# Patient Record
Sex: Female | Born: 1960 | Race: White | Hispanic: No | Marital: Married | Smoking: Never smoker
Health system: Southern US, Community
[De-identification: ages and names within clinical notes are randomized; demographics above are authoritative.]

## PROBLEM LIST (undated history)

## (undated) DIAGNOSIS — K900 Celiac disease: Secondary | ICD-10-CM

## (undated) DIAGNOSIS — I341 Nonrheumatic mitral (valve) prolapse: Secondary | ICD-10-CM

## (undated) DIAGNOSIS — F419 Anxiety disorder, unspecified: Secondary | ICD-10-CM

## (undated) DIAGNOSIS — K449 Diaphragmatic hernia without obstruction or gangrene: Secondary | ICD-10-CM

## (undated) DIAGNOSIS — C801 Malignant (primary) neoplasm, unspecified: Secondary | ICD-10-CM

## (undated) DIAGNOSIS — T7840XA Allergy, unspecified, initial encounter: Secondary | ICD-10-CM

## (undated) DIAGNOSIS — I10 Essential (primary) hypertension: Secondary | ICD-10-CM

## (undated) HISTORY — DX: Malignant (primary) neoplasm, unspecified: C80.1

## (undated) HISTORY — DX: Celiac disease: K90.0

## (undated) HISTORY — PX: MENISCUS REPAIR: SHX5179

## (undated) HISTORY — DX: Diaphragmatic hernia without obstruction or gangrene: K44.9

## (undated) HISTORY — DX: Allergy, unspecified, initial encounter: T78.40XA

## (undated) HISTORY — PX: MELANOMA EXCISION: SHX5266

## (undated) HISTORY — DX: Nonrheumatic mitral (valve) prolapse: I34.1

## (undated) HISTORY — DX: Essential (primary) hypertension: I10

## (undated) HISTORY — DX: Anxiety disorder, unspecified: F41.9

## (undated) HISTORY — PX: TRIGGER FINGER RELEASE: SHX641

---

## 2016-11-20 LAB — HM MAMMOGRAPHY

## 2017-01-14 LAB — HM COLONOSCOPY

## 2017-09-23 LAB — BASIC METABOLIC PANEL
BUN: 13 (ref 4–21)
Creatinine: 0.8 (ref <=1.1)
Glucose: 104
Potassium: 4.6 (ref 3.4–5.3)
Sodium: 141 (ref 137–147)

## 2017-09-23 LAB — CBC AND DIFFERENTIAL
HCT: 40 (ref 36–46)
Hemoglobin: 13.6 (ref 12.0–16.0)
Neutrophils Absolute: 4448
Platelets: 148 — AB (ref 150–399)
WBC: 6.6

## 2017-09-23 LAB — HEPATIC FUNCTION PANEL
ALT: 35 (ref 7–35)
AST: 17 (ref 13–35)
Alkaline Phosphatase: 64 (ref 25–125)
Bilirubin, Total: 0.9

## 2017-09-23 LAB — LIPID PANEL
Cholesterol: 184 (ref 0–200)
HDL: 42 (ref 35–70)
LDL Cholesterol: 120
Triglycerides: 111 (ref 40–160)

## 2017-09-23 LAB — TSH: TSH: 1.37 (ref <=5.90)

## 2018-05-02 DIAGNOSIS — M65312 Trigger thumb, left thumb: Secondary | ICD-10-CM | POA: Diagnosis not present

## 2018-05-12 DIAGNOSIS — M65312 Trigger thumb, left thumb: Secondary | ICD-10-CM | POA: Diagnosis not present

## 2018-05-24 DIAGNOSIS — M65312 Trigger thumb, left thumb: Secondary | ICD-10-CM | POA: Diagnosis not present

## 2018-06-23 DIAGNOSIS — R030 Elevated blood-pressure reading, without diagnosis of hypertension: Secondary | ICD-10-CM | POA: Diagnosis not present

## 2018-06-23 DIAGNOSIS — Z6831 Body mass index (BMI) 31.0-31.9, adult: Secondary | ICD-10-CM | POA: Diagnosis not present

## 2018-06-23 DIAGNOSIS — I10 Essential (primary) hypertension: Secondary | ICD-10-CM | POA: Diagnosis not present

## 2018-07-04 DIAGNOSIS — L219 Seborrheic dermatitis, unspecified: Secondary | ICD-10-CM | POA: Diagnosis not present

## 2018-07-04 DIAGNOSIS — D225 Melanocytic nevi of trunk: Secondary | ICD-10-CM | POA: Diagnosis not present

## 2018-07-04 DIAGNOSIS — D229 Melanocytic nevi, unspecified: Secondary | ICD-10-CM | POA: Diagnosis not present

## 2018-07-04 DIAGNOSIS — Z86006 Personal history of melanoma in-situ: Secondary | ICD-10-CM | POA: Diagnosis not present

## 2018-07-04 DIAGNOSIS — D485 Neoplasm of uncertain behavior of skin: Secondary | ICD-10-CM | POA: Diagnosis not present

## 2018-07-04 DIAGNOSIS — L821 Other seborrheic keratosis: Secondary | ICD-10-CM | POA: Diagnosis not present

## 2018-07-04 DIAGNOSIS — Z9889 Other specified postprocedural states: Secondary | ICD-10-CM | POA: Diagnosis not present

## 2018-08-16 ENCOUNTER — Encounter: Payer: Self-pay | Admitting: Adult Health

## 2018-08-16 ENCOUNTER — Other Ambulatory Visit: Payer: Self-pay

## 2018-08-16 ENCOUNTER — Ambulatory Visit (INDEPENDENT_AMBULATORY_CARE_PROVIDER_SITE_OTHER): Payer: BLUE CROSS/BLUE SHIELD | Admitting: Adult Health

## 2018-08-16 VITALS — Ht 66.0 in | Wt 190.0 lb

## 2018-08-16 DIAGNOSIS — K219 Gastro-esophageal reflux disease without esophagitis: Secondary | ICD-10-CM | POA: Insufficient documentation

## 2018-08-16 DIAGNOSIS — F418 Other specified anxiety disorders: Secondary | ICD-10-CM | POA: Insufficient documentation

## 2018-08-16 DIAGNOSIS — Z0000 Encounter for general adult medical examination without abnormal findings: Secondary | ICD-10-CM | POA: Diagnosis not present

## 2018-08-16 DIAGNOSIS — I10 Essential (primary) hypertension: Secondary | ICD-10-CM | POA: Diagnosis not present

## 2018-08-16 MED ORDER — ALBUTEROL SULFATE HFA 108 (90 BASE) MCG/ACT IN AERS: 2.0000 | INHALATION_SPRAY | Freq: Four times a day (QID) | RESPIRATORY_TRACT | 1 refills | Status: AC | PRN

## 2018-08-16 MED ORDER — LOSARTAN POTASSIUM 100 MG PO TABS: 100.0000 mg | ORAL_TABLET | Freq: Every day | ORAL | 2 refills | Status: DC

## 2018-08-16 NOTE — Assessment & Plan Note (Signed)
  Assessment and Plan: Continue all medications as directed Remain well hydrated, follow Mediterranean Diet Reduce CHO snacking Continue regular exercise Try Esomeprazole very other day for GERD control- in attempts to wean off COVID-19 Education: Signs and symptoms of COVID-19 infection were discussed with pt and how to seek care for testing.  The importance of following the Stay at Home order, and when out- Social Distancing and wearing a facial mask were discussed today.  Follow Up Instructions: 3 months CPE- week prior with fasting lab appt    I discussed the assessment and treatment plan with the patient. The patient was provided an opportunity to ask questions and all were answered. The patient agreed with the plan and demonstrated an understanding of the instructions.   The patient was advised to call back or seek an in-person evaluation if the symptoms worsen or if the condition fails to improve as anticipated.

## 2018-08-16 NOTE — Assessment & Plan Note (Signed)
When flying- Alprazolam 0.25mg  PRN Does not need refill

## 2018-08-16 NOTE — Assessment & Plan Note (Signed)
Has been on esomprazole for years and concerned about SE due to long term use Recommend using over other day for sx control and to see if she can wean off Avoid spicy/acidic foods Do not over-eat

## 2018-08-16 NOTE — Progress Notes (Signed)
Virtual Visit via Telephone Note  I connected with Diamond Trujillo on 08/16/18 at 11:15 AM EDT by telephone and verified that I am speaking with the correct person using two identifiers.  Location: Patient: Home Provider: In Clinic   I discussed the limitations, risks, security and privacy concerns of performing an evaluation and management service by telephone and the availability of in person appointments. I also discussed with the patient that there may be a patient responsible charge related to this service. The patient expressed understanding and agreed to proceed.   History of Present Illness: Diamond Trujillo calls in to establish as a new pt. She is a pleasant 58 year old female. PMH: Obesity, Celiac Disease, GERD, Hiatal Hernia,  HTN- losartan 100mg  QD  She reports >25 lbs weight gain since moving to [place]- current wt 190 She reports enjoying too many CHO/glutten free desserts She and her husband started regular exercise program- Walking daily 5-6: 1.5 mile  [employer] 5-6  She denies tobacco/vape use She enjoys 5 cocktails/week She reports that her mother passed away at age 65 from lung ca with mets to brain- she was extremely heavy smoker and excessive ETOH use She has no knowledge of her biological father Her half-siblings are all in good health Review of Systems: General:   Denies fever, chills, unexplained weight loss.  Optho/Auditory:   Denies visual changes, blurred vision/LOV Respiratory:   Denies SOB, DOE more than baseline levels.  Cardiovascular:   Denies chest pain, palpitations, new onset peripheral edema  Gastrointestinal:   Denies nausea, vomiting, diarrhea.  Genitourinary: Denies dysuria, freq/ urgency, flank pain or discharge from genitals.  Endocrine:     Denies hot or cold intolerance, polyuria, polydipsia. Musculoskeletal:   Denies unexplained myalgias, joint swelling, unexplained arthralgias, gait problems.  Skin:  Denies rash, suspicious  lesions Neurological:     Denies dizziness, unexplained weakness, numbness  Psychiatric/Behavioral:   Denies mood changes, suicidal or homicidal ideations, hallucinations COVID-19 Education: Signs and symptoms of COVID-19 infection were discussed with pt and how to seek care for testing.  The importance of following the Stay at Home order, and when out- Social Distancing and wearing a facial mask were discussed today.   Patient Care Team    Relationship Specialty Notifications Start End  Esaw Grandchild, NP PCP - General Family Medicine  08/14/18     Patient Active Problem List   Diagnosis Date Noted  . Healthcare maintenance 08/16/2018  . Situational anxiety 08/16/2018  . HTN, goal below 130/80 08/16/2018     Past Medical History:  Diagnosis Date  . Cancer ([place])    melanoma  . Celiac disease   . Hypertension   . MVP (mitral valve prolapse)      Past Surgical History:  Procedure Laterality Date  . CESAREAN SECTION    . MELANOMA EXCISION    . MENISCUS REPAIR Right      Family History  Problem Relation Age of Onset  . Cancer Mother        liver, lung, brain  . Hypertension Mother   . Alcohol abuse Mother      Social History   Substance and Sexual Activity  Drug Use Never     Social History   Substance and Sexual Activity  Alcohol Use Yes  . Alcohol/week: 5.0 standard drinks  . Types: 5 Cans of beer per week     Social History   Tobacco Use  Smoking Status Never Smoker  Smokeless Tobacco Never  Used     Outpatient Encounter Medications as of 08/16/2018  Medication Sig  . albuterol (VENTOLIN HFA) 108 (90 Base) MCG/ACT inhaler Inhale 2 puffs into the lungs every 6 (six) hours as needed for wheezing or shortness of breath.  . ALPRAZolam (XANAX) 0.25 MG tablet Take 0.25 mg by mouth. Take 1 tablet prior to flying  . esomeprazole (NEXIUM) 20 MG packet Take 20 mg by mouth daily before breakfast.  . losartan (COZAAR) 100 MG tablet Take 1 tablet (100 mg  total) by mouth daily.  . [DISCONTINUED] albuterol (VENTOLIN HFA) 108 (90 Base) MCG/ACT inhaler Inhale 2 puffs into the lungs every 6 (six) hours as needed for wheezing or shortness of breath.  . [DISCONTINUED] losartan (COZAAR) 100 MG tablet Take 1 tablet by mouth daily.   No facility-administered encounter medications on file as of 08/16/2018.     Allergies: Gluten meal  Body mass index is 30.67 kg/m.  Height 5\' 6"  (1.676 m), weight 190 lb (86.2 kg).    Observations/Objective: No acute distress noted during the telephone   Assessment and Plan: Continue all medications as directed Remain well hydrated, follow Mediterranean Diet Reduce CHO snacking Continue regular exercise Try Esomeprazole very other day for GERD control- in attempts to wean off COVID-19 Education: Signs and symptoms of COVID-19 infection were discussed with pt and how to seek care for testing.  The importance of following the Stay at Home order, and when out- Social Distancing and wearing a facial mask were discussed today.  Follow Up Instructions: 3 months CPE- week prior with fasting lab appt    I discussed the assessment and treatment plan with the patient. The patient was provided an opportunity to ask questions and all were answered. The patient agreed with the plan and demonstrated an understanding of the instructions.   The patient was advised to call back or seek an in-person evaluation if the symptoms worsen or if the condition fails to improve as anticipated.  I provided 35 minutes of non-face-to-face time during this encounter.   Esaw Grandchild, NP

## 2018-08-16 NOTE — Assessment & Plan Note (Signed)
Losartan 100mg  QD, increased from 50mg  to 100mg  in 2018

## 2018-08-17 LAB — EKG 12-LEAD

## 2018-08-17 NOTE — Addendum Note (Signed)
Addended by: Mina Marble D on: 08/17/2018 03:23 PM   Modules accepted: Orders

## 2018-08-22 DIAGNOSIS — D239 Other benign neoplasm of skin, unspecified: Secondary | ICD-10-CM | POA: Diagnosis not present

## 2018-09-05 ENCOUNTER — Encounter: Payer: Self-pay | Admitting: Adult Health

## 2018-09-05 ENCOUNTER — Other Ambulatory Visit: Payer: Self-pay

## 2018-09-05 ENCOUNTER — Ambulatory Visit: Payer: BLUE CROSS/BLUE SHIELD | Admitting: Adult Health

## 2018-09-05 VITALS — BP 133/76 | HR 68 | Temp 98.1°F | Ht 66.0 in | Wt 193.5 lb

## 2018-09-05 DIAGNOSIS — Z4802 Encounter for removal of sutures: Secondary | ICD-10-CM | POA: Diagnosis not present

## 2018-09-05 DIAGNOSIS — Z0000 Encounter for general adult medical examination without abnormal findings: Secondary | ICD-10-CM

## 2018-09-05 NOTE — Assessment & Plan Note (Addendum)
One running suture removed from L posterior back by Jennet Maduro CMA Pt tolerated well. 0.5cm portion of L side suture line slightly opened- closed with dermabond- dressed with tefla and tex[place] band-aid. Site cleaned prior to and after procedure, dressed as described above. Please call your dermatologist with any concerns about healing.

## 2018-09-05 NOTE — Assessment & Plan Note (Signed)
Continue to social distance and wear a mask when in public 

## 2018-09-05 NOTE — Progress Notes (Signed)
Subjective:    Patient ID: Diamond Trujillo, female    DOB: 12/01/1960, 58 y.o.   MRN: [id number]  HPI:  Diamond Trujillo presents today for suture removal r/t suspicious mole removal completed 14 days ago at [facility]/Dr. Wrestler. Per pt, pathology was benign. She denies pain or drainage at site. She reports intermittent itching. She denies fever/night sweats/N/V/D. She denies tobacco use, reports diet rich in protein.  Patient Care Team    Relationship Specialty Notifications Start End  Esaw Grandchild, NP PCP - General Family Medicine  08/14/18     Patient Active Problem List   Diagnosis Date Noted  . Visit for suture removal 09/05/2018  . Healthcare maintenance 08/16/2018  . Situational anxiety 08/16/2018  . HTN, goal below 130/80 08/16/2018  . Gastroesophageal reflux disease 08/16/2018     Past Medical History:  Diagnosis Date  . Cancer ([place])    melanoma  . Celiac disease   . Hypertension   . MVP (mitral valve prolapse)      Past Surgical History:  Procedure Laterality Date  . CESAREAN SECTION    . MELANOMA EXCISION    . MENISCUS REPAIR Right      Family History  Problem Relation Age of Onset  . Cancer Mother        liver, lung, brain  . Hypertension Mother   . Alcohol abuse Mother      Social History   Substance and Sexual Activity  Drug Use Never     Social History   Substance and Sexual Activity  Alcohol Use Yes  . Alcohol/week: 5.0 standard drinks  . Types: 5 Cans of beer per week     Social History   Tobacco Use  Smoking Status Never Smoker  Smokeless Tobacco Never Used     Outpatient Encounter Medications as of 09/05/2018  Medication Sig  . albuterol (VENTOLIN HFA) 108 (90 Base) MCG/ACT inhaler Inhale 2 puffs into the lungs every 6 (six) hours as needed for wheezing or shortness of breath.  . ALPRAZolam (XANAX) 0.25 MG tablet Take 0.25 mg by mouth. Take 1 tablet prior to flying  . esomeprazole (NEXIUM) 20 MG packet Take 20 mg  by mouth daily before breakfast.  . losartan (COZAAR) 100 MG tablet Take 1 tablet (100 mg total) by mouth daily.   No facility-administered encounter medications on file as of 09/05/2018.     Allergies: Gluten meal  Body mass index is 31.23 kg/m.  Blood pressure 133/76, pulse 68, temperature 98.1 F (36.7 C), temperature source Oral, height 5\' 6"  (1.676 m), weight 193 lb 8 oz (87.8 kg).  Review of Systems  Constitutional: Positive for fatigue. Negative for activity change, appetite change, chills, diaphoresis, fever and unexpected weight change.  Respiratory: Negative for cough, chest tightness, shortness of breath, wheezing and stridor.   Cardiovascular: Negative for chest pain, palpitations and leg swelling.  Gastrointestinal: Negative for abdominal distention, abdominal pain, blood in stool, constipation, diarrhea, nausea, rectal pain and vomiting.  Genitourinary: Negative for difficulty urinating and flank pain.  Skin: Positive for color change and wound. Negative for pallor and rash.  Neurological: Negative for dizziness and headaches.  Hematological: Does not bruise/bleed easily.  Psychiatric/Behavioral: Negative for agitation, behavioral problems, confusion, decreased concentration, dysphoric mood, hallucinations, self-injury, sleep disturbance and suicidal ideas. The patient is not nervous/anxious and is not hyperactive.        Objective:   Physical Exam Vitals signs and nursing note reviewed.  Constitutional:  General: She is not in acute distress.    Appearance: Normal appearance. She is normal weight. She is not ill-appearing, toxic-appearing or diaphoretic.  Skin:    General: Skin is warm.     Capillary Refill: Capillary refill takes less than 2 seconds.     Findings: Erythema and laceration present.          Comments: Approx 4cm linear laceration on L posterior back One running suture removed from L posterior back by Jennet Maduro CMA Pt tolerated well.  0.5cm portion of L side suture line slightly opened- closed with dermabond- dressed with tefla and texas band-aid. Site cleaned prior to and after procedure, dressed as described above.  Neurological:     Mental Status: She is alert and oriented to person, place, and time.  Psychiatric:        Mood and Affect: Mood normal.        Behavior: Behavior normal.        Thought Content: Thought content normal.        Judgment: Judgment normal.           Assessment & Plan:   1. Visit for suture removal   2. Healthcare maintenance     Visit for suture removal One running suture removed from L posterior back by Jennet Maduro CMA Pt tolerated well. 0.5cm portion of L side suture line slightly opened- closed with dermabond- dressed with tefla and texas band-aid. Site cleaned prior to and after procedure, dressed as described above. Please call your dermatologist with any concerns about healing.   Healthcare maintenance Continue to social distance and wear a mask when in public.    FOLLOW-UP:  Return if symptoms worsen or fail to improve.

## 2018-09-05 NOTE — Patient Instructions (Signed)
Wound Care, Adult Taking care of your wound properly can help to prevent pain, infection, and scarring. It can also help your wound to heal more quickly. How to care for your wound Wound care      Follow instructions from your health care provider about how to take care of your wound. Make sure you: ? Wash your hands with soap and water before you change the bandage (dressing). If soap and water are not available, use hand sanitizer. ? Change your dressing as told by your health care provider. ? Leave stitches (sutures), skin glue, or adhesive strips in place. These skin closures may need to stay in place for 2 weeks or longer. If adhesive strip edges start to loosen and curl up, you may trim the loose edges. Do not remove adhesive strips completely unless your health care provider tells you to do that.  Check your wound area every day for signs of infection. Check for: ? Redness, swelling, or pain. ? Fluid or blood. ? Warmth. ? Pus or a bad smell.  Ask your health care provider if you should clean the wound with mild soap and water. Doing this may include: ? Using a clean towel to pat the wound dry after cleaning it. Do not rub or scrub the wound. ? Applying a cream or ointment. Do this only as told by your health care provider. ? Covering the incision with a clean dressing.  Ask your health care provider when you can leave the wound uncovered.  Keep the dressing dry until your health care provider says it can be removed. Do not take baths, swim, use a hot tub, or do anything that would put the wound underwater until your health care provider approves. Ask your health care provider if you can take showers. You may only be allowed to take sponge baths. Medicines   If you were prescribed an antibiotic medicine, cream, or ointment, take or use the antibiotic as told by your health care provider. Do not stop taking or using the antibiotic even if your condition improves.  Take  over-the-counter and prescription medicines only as told by your health care provider. If you were prescribed pain medicine, take it 30 or more minutes before you do any wound care or as told by your health care provider. General instructions  Return to your normal activities as told by your health care provider. Ask your health care provider what activities are safe.  Do not scratch or pick at the wound.  Do not use any products that contain nicotine or tobacco, such as cigarettes and e-cigarettes. These may delay wound healing. If you need help quitting, ask your health care provider.  Keep all follow-up visits as told by your health care provider. This is important.  Eat a diet that includes protein, vitamin A, vitamin C, and other nutrient-rich foods to help the wound heal. ? Foods rich in protein include meat, dairy, beans, nuts, and other sources. ? Foods rich in vitamin A include carrots and dark green, leafy vegetables. ? Foods rich in vitamin C include citrus, tomatoes, and other fruits and vegetables. ? Nutrient-rich foods have protein, carbohydrates, fat, vitamins, or minerals. Eat a variety of healthy foods including vegetables, fruits, and whole grains. Contact a health care provider if:  You received a tetanus shot and you have swelling, severe pain, redness, or bleeding at the injection site.  Your pain is not controlled with medicine.  You have redness, swelling, or pain around the wound.    You have fluid or blood coming from the wound.  Your wound feels warm to the touch.  You have pus or a bad smell coming from the wound.  You have a fever or chills.  You are nauseous or you vomit.  You are dizzy. Get help right away if:  You have a red streak going away from your wound.  The edges of the wound open up and separate.  Your wound is bleeding, and the bleeding does not stop with gentle pressure.  You have a rash.  You faint.  You have trouble breathing.  Summary  Always wash your hands with soap and water before changing your bandage (dressing).  To help with healing, eat foods that are rich in protein, vitamin A, vitamin C, and other nutrients.  Check your wound every day for signs of infection. Contact your health care provider if you suspect that your wound is infected. This information is not intended to replace advice given to you by your health care provider. Make sure you discuss any questions you have with your health care provider. Document Released: 01/06/2008 Document Revised: 05/10/2017 Document Reviewed: 10/14/2015 Elsevier Interactive Patient Education  2019 [place].  Please call your dermatologist with any concerns about healing. Continue to social distance and wear a mask when in public. NICE TO SEE YOU!

## 2018-09-11 ENCOUNTER — Encounter: Payer: Self-pay | Admitting: Obstetrics & Gynecology

## 2018-09-20 DIAGNOSIS — M19071 Primary osteoarthritis, right ankle and foot: Secondary | ICD-10-CM | POA: Diagnosis not present

## 2018-10-17 DIAGNOSIS — D2270 Melanocytic nevi of unspecified lower limb, including hip: Secondary | ICD-10-CM | POA: Diagnosis not present

## 2018-10-17 DIAGNOSIS — D489 Neoplasm of uncertain behavior, unspecified: Secondary | ICD-10-CM | POA: Diagnosis not present

## 2018-10-17 DIAGNOSIS — Z86006 Personal history of melanoma in-situ: Secondary | ICD-10-CM | POA: Diagnosis not present

## 2018-10-17 DIAGNOSIS — Z86018 Personal history of other benign neoplasm: Secondary | ICD-10-CM | POA: Diagnosis not present

## 2018-11-08 ENCOUNTER — Other Ambulatory Visit: Payer: Self-pay

## 2018-11-10 ENCOUNTER — Other Ambulatory Visit: Payer: Self-pay

## 2018-11-10 ENCOUNTER — Ambulatory Visit: Payer: BC Managed Care – PPO | Admitting: Obstetrics & Gynecology

## 2018-11-10 ENCOUNTER — Other Ambulatory Visit (HOSPITAL_COMMUNITY)
Admission: RE | Admit: 2018-11-10 | Discharge: 2018-11-10 | Disposition: A | Discharge status: Home / Self Care | Payer: BC Managed Care – PPO | Source: Ambulatory Visit | Attending: Obstetrics & Gynecology | Admitting: Obstetrics & Gynecology

## 2018-11-10 ENCOUNTER — Encounter: Payer: Self-pay | Admitting: Obstetrics & Gynecology

## 2018-11-10 VITALS — BP 128/76 | HR 72 | Temp 97.2°F | Ht 65.0 in | Wt 193.0 lb

## 2018-11-10 DIAGNOSIS — Z124 Encounter for screening for malignant neoplasm of cervix: Secondary | ICD-10-CM | POA: Diagnosis not present

## 2018-11-10 DIAGNOSIS — Z01419 Encounter for gynecological examination (general) (routine) without abnormal findings: Secondary | ICD-10-CM

## 2018-11-10 NOTE — Progress Notes (Signed)
58 y.o. G3P3 Married White or Caucasian female here as new patient for annual exam.  [place].  She is establishing care.  She works from home and her job is based in [place].  She has been working from home for over a year.  Denies vaginal bleeding.  Not on HRT.      Frustrated with weight.    Patient's last menstrual period was 04/12/2016 (approximate).          Sexually active: Yes.    The current method of family planning is post menopausal status.    Exercising: Yes.    HIT Smoker:  no  Health Maintenance: Pap:  10/12/17 neg History of abnormal Pap:  no MMG:  11/20/16 Normal Colonoscopy:  01/14/17 f/u 10 years BMD:   Never TDaP:  Unsure  Pneumonia vaccine(s):  n/a Shingrix:   No Hep C testing: No Screening Labs: PCP   reports that she has never smoked. She has never used smokeless tobacco. She reports current alcohol use of about 5.0 standard drinks of alcohol per week. She reports that she does not use drugs.  Past Medical History:  Diagnosis Date  . Anxiety   . Cancer ([place])    melanoma  . Celiac disease   . Hiatal hernia   . High blood pressure   . Hypertension   . Melanoma ([place])   . MVP (mitral valve prolapse)     Past Surgical History:  Procedure Laterality Date  . CESAREAN SECTION     x 3  . MELANOMA EXCISION    . MENISCUS REPAIR Right   . TRIGGER FINGER RELEASE     Left Thumb     Current Outpatient Medications  Medication Sig Dispense Refill  . albuterol (VENTOLIN HFA) 108 (90 Base) MCG/ACT inhaler Inhale 2 puffs into the lungs every 6 (six) hours as needed for wheezing or shortness of breath. 1 Inhaler 1  . esomeprazole (NEXIUM) 20 MG packet Take 20 mg by mouth daily before breakfast.    . losartan (COZAAR) 100 MG tablet Take 1 tablet (100 mg total) by mouth daily. 90 tablet 2  . meloxicam (MOBIC) 15 MG tablet Take 15 mg by mouth daily as needed for pain.    Marland Kitchen ALPRAZolam (XANAX) 0.25 MG tablet Take 0.25 mg by  mouth. Take 1 tablet prior to flying     No current facility-administered medications for this visit.     Family History  Problem Relation Age of Onset  . Cancer Mother        liver, lung, brain  . Hypertension Mother   . Alcohol abuse Mother     Review of Systems  All other systems reviewed and are negative.   Exam:   BP 128/76   Pulse 72   Temp (!) 97.2 F (36.2 C) (Temporal)   Ht 5\' 5"  (1.651 m)   Wt 193 lb (87.5 kg)   LMP 04/12/2016 (Approximate)   BMI 32.12 kg/m    Height: 5\' 5"  (165.1 cm)  Ht Readings from Last 3 Encounters:  11/10/18 5\' 5"  (1.651 m)  09/05/18 5\' 6"  (1.676 m)  08/16/18 5\' 6"  (1.676 m)    General appearance: alert, cooperative and appears stated age Head: Normocephalic, without obvious abnormality, atraumatic Neck: no adenopathy, supple, symmetrical, trachea midline and thyroid normal to inspection and palpation Lungs: clear to auscultation bilaterally Breasts: normal appearance, no masses or tenderness Heart: regular rate and rhythm Abdomen: soft, non-tender; bowel  sounds normal; no masses,  no organomegaly Extremities: extremities normal, atraumatic, no cyanosis or edema Skin: Skin color, texture, turgor normal. No rashes or lesions Lymph nodes: Cervical, supraclavicular, and axillary nodes normal. No abnormal inguinal nodes palpated Neurologic: Grossly normal   Pelvic: External genitalia:  no lesions              Urethra:  normal appearing urethra with no masses, tenderness or lesions              Bartholins and Skenes: normal                 Vagina: normal appearing vagina with normal color and discharge, no lesions              Cervix: no lesions              Pap taken: Yes.   Bimanual Exam:  Uterus:  normal size, contour, position, consistency, mobility, non-tender              Adnexa: normal adnexa and no mass, fullness, tenderness               Rectovaginal: Confirms               Anus:  normal sphincter tone, no  lesions  Chaperone was present for exam.  A:  Well Woman with normal exam PMP, no HRT Hypertension Celiac disease  P:   Mammogram guidelines reviewed.  Information given.  3D MMG.  Information about locations given to pt today. pap smear with HR HPV obtained Lab work  Reviewed Tdap.  She will need this at some point as she does not know when her last one was.  Declines today. Colonoscopy UTD Plan BMD around age 60 Return annually or prn

## 2018-11-10 NOTE — Patient Instructions (Addendum)
[facility]   [address] [place], [place] [address]  [place], MD  [facility] [address], [place], [place] [address]  Phone:  [phone]

## 2018-11-14 LAB — CYTOLOGY - PAP
Diagnosis: NEGATIVE
HPV: NOT DETECTED

## 2018-12-04 ENCOUNTER — Other Ambulatory Visit: Payer: BC Managed Care – PPO

## 2018-12-04 ENCOUNTER — Other Ambulatory Visit: Payer: Self-pay

## 2018-12-04 DIAGNOSIS — Z0000 Encounter for general adult medical examination without abnormal findings: Secondary | ICD-10-CM

## 2018-12-04 DIAGNOSIS — I10 Essential (primary) hypertension: Secondary | ICD-10-CM

## 2018-12-05 LAB — CBC WITH DIFFERENTIAL/PLATELET
Basophils Absolute: 0.1 10*3/uL (ref 0.0–0.2)
Basos: 1 %
EOS (ABSOLUTE): 0.1 10*3/uL (ref 0.0–0.4)
Eos: 1 %
Hematocrit: 40.7 % (ref 34.0–46.6)
Hemoglobin: 14 g/dL (ref 11.1–15.9)
Immature Grans (Abs): 0 10*3/uL (ref 0.0–0.1)
Immature Granulocytes: 0 %
Lymphocytes Absolute: 1.6 10*3/uL (ref 0.7–3.1)
Lymphs: 25 %
MCH: 30.6 pg (ref 26.6–33.0)
MCHC: 34.4 g/dL (ref 31.5–35.7)
MCV: 89 fL (ref 79–97)
Monocytes Absolute: 0.5 10*3/uL (ref 0.1–0.9)
Monocytes: 8 %
Neutrophils Absolute: 4 10*3/uL (ref 1.4–7.0)
Neutrophils: 65 %
Platelets: 165 10*3/uL (ref 150–450)
RBC: 4.58 x10E6/uL (ref 3.77–5.28)
RDW: 12.2 % (ref 11.7–15.4)
WBC: 6.3 10*3/uL (ref 3.4–10.8)

## 2018-12-05 LAB — LIPID PANEL
Chol/HDL Ratio: 3.9 ratio (ref 0.0–4.4)
Cholesterol, Total: 173 mg/dL (ref 100–199)
HDL: 44 mg/dL (ref >39)
LDL Calculated: 104 mg/dL — ABNORMAL HIGH (ref 0–99)
Triglycerides: 124 mg/dL (ref 0–149)
VLDL Cholesterol Cal: 25 mg/dL (ref 5–40)

## 2018-12-05 LAB — HEMOGLOBIN A1C
Est. average glucose Bld gHb Est-mCnc: 114 mg/dL
Hgb A1c MFr Bld: 5.6 % (ref 4.8–5.6)

## 2018-12-05 LAB — COMPREHENSIVE METABOLIC PANEL
ALT: 32 IU/L (ref 0–32)
AST: 19 IU/L (ref 0–40)
Albumin/Globulin Ratio: 2.7 — ABNORMAL HIGH (ref 1.2–2.2)
Albumin: 4.8 g/dL (ref 3.8–4.9)
Alkaline Phosphatase: 69 IU/L (ref 39–117)
BUN/Creatinine Ratio: 17 (ref 9–23)
BUN: 14 mg/dL (ref 6–24)
Bilirubin Total: 0.8 mg/dL (ref 0.0–1.2)
CO2: 25 mmol/L (ref 20–29)
Calcium: 9.9 mg/dL (ref 8.7–10.2)
Chloride: 100 mmol/L (ref 96–106)
Creatinine, Ser: 0.81 mg/dL (ref 0.57–1.00)
GFR calc Af Amer: 93 mL/min/{1.73_m2} (ref >59)
GFR calc non Af Amer: 81 mL/min/{1.73_m2} (ref >59)
Globulin, Total: 1.8 g/dL (ref 1.5–4.5)
Glucose: 112 mg/dL — ABNORMAL HIGH (ref 65–99)
Potassium: 5 mmol/L (ref 3.5–5.2)
Sodium: 139 mmol/L (ref 134–144)
Total Protein: 6.6 g/dL (ref 6.0–8.5)

## 2018-12-05 LAB — TSH: TSH: 1.43 u[IU]/mL (ref 0.450–4.500)

## 2018-12-11 NOTE — Progress Notes (Signed)
Subjective:    Patient ID: Diamond Trujillo, female    DOB: Oct 24, 1960, 58 y.o.   MRN: [id number]  HPI:  08/16/2018 OV: Diamond Trujillo calls in to establish as a new pt. She is a pleasant 58 year old female. PMH: Obesity, Celiac Disease, GERD, Hiatal Hernia,  HTN- losartan 100mg  QD  She reports >25 lbs weight gain since moving to [place]- current wt 190 She reports enjoying too many CHO/glutten free desserts She and her husband started regular exercise program- Walking daily 5-6: 1.5 mile  Team Body Product 5-6  She denies tobacco/vape use She enjoys 5 cocktails/week She reports that her mother passed away at age 7 from lung ca with mets to brain- she was extremely heavy smoker and excessive ETOH use She has no knowledge of her biological father Her half-siblings are all in good health 12/12/2018 OV: Diamond Trujillo is here for CPE She has lost >20 lbs in last 2 months due to intermittent fasting Current wt 190 Body mass index is 31.7 kg/m.  She estimates to drink >75 oz water/day She remains active with walking and house/yard work She continues to abstain from tobacco/vape/ETOH use  Reviewed Labs 12/04/2018: TSH-WNL, 1.430  A1c-WNL, 5.6  CMP-stable  CBC-stable  The 10-year ASCVD risk score Diamond Bussing DC Jr., et al., 2013) is: 3.4%  Values used to calculate the score:   Age: 61 years   Sex: Female   Is Non-Hispanic African American: No   Diabetic: No   Tobacco smoker: No   Systolic Blood Pressure: 0000000 mmHg   Is BP treated: Yes   HDL Cholesterol: 44 mg/dL   Total Cholesterol: 173 mg/dL  LDL-104   Healthcare Maintenance: PAP-11/10/2018, completed with OB/GYN Mammogram-she will schedule Colonoscopy-UTD Immunizations-UTD  Patient Care Team    Relationship Specialty Notifications Start End  Esaw Grandchild, NP PCP - General Family Medicine  08/14/18     Patient Active Problem List   Diagnosis Date Noted  . Visit for suture removal 09/05/2018  . Healthcare  maintenance 08/16/2018  . Situational anxiety 08/16/2018  . HTN (hypertension) 08/16/2018  . Gastroesophageal reflux disease 08/16/2018     Past Medical History:  Diagnosis Date  . Anxiety   . Cancer ([place])    melanoma  . Celiac disease   . Hiatal hernia   . Hypertension   . MVP (mitral valve prolapse)      Past Surgical History:  Procedure Laterality Date  . CESAREAN SECTION     x 3  . MELANOMA EXCISION    . MENISCUS REPAIR Right   . TRIGGER FINGER RELEASE     Left Thumb      Family History  Problem Relation Age of Onset  . Cancer Mother        liver, lung, brain  . Hypertension Mother   . Alcohol abuse Mother      Social History   Substance and Sexual Activity  Drug Use Never     Social History   Substance and Sexual Activity  Alcohol Use Yes  . Alcohol/week: 5.0 standard drinks  . Types: 5 Cans of beer per week     Social History   Tobacco Use  Smoking Status Never Smoker  Smokeless Tobacco Never Used     Outpatient Encounter Medications as of 12/12/2018  Medication Sig  . albuterol (VENTOLIN HFA) 108 (90 Base) MCG/ACT inhaler Inhale 2 puffs into the lungs every 6 (six) hours as needed for wheezing or shortness of breath.  Marland Kitchen  ALPRAZolam (XANAX) 0.25 MG tablet Take 0.25 mg by mouth. Take 1 tablet prior to flying  . esomeprazole (NEXIUM) 20 MG packet Take 20 mg by mouth daily before breakfast.  . losartan (COZAAR) 100 MG tablet Take 1 tablet (100 mg total) by mouth daily.  . meloxicam (MOBIC) 15 MG tablet Take 15 mg by mouth daily as needed for pain.   No facility-administered encounter medications on file as of 12/12/2018.     Allergies: Gluten meal  Body mass index is 31.7 kg/m.  Blood pressure 131/80, pulse 73, temperature 98.8 F (37.1 C), temperature source Oral, height 5\' 5"  (1.651 m), weight 190 lb 8 oz (86.4 kg), last menstrual period 04/12/2016, SpO2 97 %.     Review of Systems  Constitutional: Positive for fatigue. Negative  for activity change, appetite change, chills, diaphoresis, fever and unexpected weight change.  HENT: Negative for congestion.   Eyes: Negative for visual disturbance.  Respiratory: Negative for cough, chest tightness, shortness of breath, wheezing and stridor.   Cardiovascular: Negative for chest pain, palpitations and leg swelling.  Gastrointestinal: Negative for abdominal distention, anal bleeding, blood in stool, constipation, diarrhea, nausea and vomiting.  Endocrine: Negative for cold intolerance, heat intolerance, polydipsia, polyphagia and polyuria.  Genitourinary: Negative for difficulty urinating and flank pain.  Musculoskeletal: Positive for arthralgias, gait problem and joint swelling. Negative for back pain, myalgias, neck pain and neck stiffness.  Skin: Negative for color change, pallor, rash and wound.  Neurological: Negative for dizziness and headaches.  Hematological: Negative for adenopathy. Does not bruise/bleed easily.  Psychiatric/Behavioral: Negative for agitation, behavioral problems, confusion, decreased concentration, dysphoric mood, hallucinations, self-injury, sleep disturbance and suicidal ideas. The patient is not nervous/anxious and is not hyperactive.        Objective:   Physical Exam Vitals signs and nursing note reviewed.  Constitutional:      General: She is not in acute distress.    Appearance: She is obese. She is not ill-appearing, toxic-appearing or diaphoretic.  HENT:     Head: Normocephalic and atraumatic.     Right Ear: Tympanic membrane, ear canal and external ear normal. There is no impacted cerumen.     Left Ear: Tympanic membrane, ear canal and external ear normal. There is no impacted cerumen.     Nose: Nose normal. No congestion.     Mouth/Throat:     Mouth: Mucous membranes are moist.     Pharynx: No oropharyngeal exudate.  Eyes:     Extraocular Movements: Extraocular movements intact.     Conjunctiva/sclera: Conjunctivae normal.      Pupils: Pupils are equal, round, and reactive to light.  Neck:     Musculoskeletal: Normal range of motion and neck supple. No muscular tenderness.  Cardiovascular:     Rate and Rhythm: Normal rate and regular rhythm.     Pulses: Normal pulses.     Heart sounds: No murmur. No friction rub. No gallop.   Pulmonary:     Effort: Pulmonary effort is normal. No respiratory distress.     Breath sounds: Normal breath sounds. Stridor present. No wheezing, rhonchi or rales.  Chest:     Chest wall: No tenderness.  Abdominal:     General: Abdomen is protuberant. Bowel sounds are normal. There is no distension.     Palpations: Abdomen is soft. There is no mass.     Tenderness: There is no abdominal tenderness. There is no right CVA tenderness, left CVA tenderness, guarding or rebound.  Hernia: No hernia is present.  Musculoskeletal:     Comments: R Foot- R great tow- Bunion  Skin:    General: Skin is warm and dry.     Capillary Refill: Capillary refill takes less than 2 seconds.     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  Psychiatric:        Mood and Affect: Mood normal.        Behavior: Behavior normal.        Thought Content: Thought content normal.        Judgment: Judgment normal.       Assessment & Plan:   1. Bunion, right foot   2. Healthcare maintenance   3. Essential hypertension   4. Situational anxiety     Healthcare maintenance  Great job on weight loss. Remain well hydrated, follow heart healthy diet, remain as active as possible. Lab work is stable, recommend repeating A1c at follow-up in 6 months. Continue all medications as directed. Continue close follow-up with Dermatologist. Continue to social distance and wear a mask when in public. Follow-up in 6 months.  HTN (hypertension) BP at goal 131/80, HR 73 Continue Losartan 100mg   QD  Situational anxiety Stable    FOLLOW-UP:  Return in about 6 months (around 06/11/2019) for HTN, Regular Follow Up, A1c re-check.

## 2018-12-12 ENCOUNTER — Encounter: Payer: Self-pay | Admitting: Adult Health

## 2018-12-12 ENCOUNTER — Other Ambulatory Visit: Payer: Self-pay

## 2018-12-12 ENCOUNTER — Ambulatory Visit (INDEPENDENT_AMBULATORY_CARE_PROVIDER_SITE_OTHER): Payer: BC Managed Care – PPO | Admitting: Adult Health

## 2018-12-12 VITALS — BP 131/80 | HR 73 | Temp 98.8°F | Ht 65.0 in | Wt 190.5 lb

## 2018-12-12 DIAGNOSIS — F418 Other specified anxiety disorders: Secondary | ICD-10-CM | POA: Diagnosis not present

## 2018-12-12 DIAGNOSIS — M21611 Bunion of right foot: Secondary | ICD-10-CM | POA: Diagnosis not present

## 2018-12-12 DIAGNOSIS — Z0000 Encounter for general adult medical examination without abnormal findings: Secondary | ICD-10-CM

## 2018-12-12 DIAGNOSIS — I10 Essential (primary) hypertension: Secondary | ICD-10-CM

## 2018-12-12 NOTE — Assessment & Plan Note (Signed)
BP at goal 131/80, HR 73 Continue Losartan 100mg  QD

## 2018-12-12 NOTE — Patient Instructions (Addendum)
Preventive Care for Adults, Female  A healthy lifestyle and preventive care can promote health and wellness. Preventive health guidelines for women include the following key practices.   A routine yearly physical is a good way to check with your health care provider about your health and preventive screening. It is a chance to share any concerns and updates on your health and to receive a thorough exam.   Visit your dentist for a routine exam and preventive care every 6 months. Brush your teeth twice a day and floss once a day. Good oral hygiene prevents tooth decay and gum disease.   The frequency of eye exams is based on your age, health, family medical history, use of contact lenses, and other factors. Follow your health care provider's recommendations for frequency of eye exams.   Eat a healthy diet. Foods like vegetables, fruits, whole grains, low-fat dairy products, and lean protein foods contain the nutrients you need without too many calories. Decrease your intake of foods high in solid fats, added sugars, and salt. Eat the right amount of calories for you.Get information about a proper diet from your health care provider, if necessary.   Regular physical exercise is one of the most important things you can do for your health. Most adults should get at least 150 minutes of moderate-intensity exercise (any activity that increases your heart rate and causes you to sweat) each week. In addition, most adults need muscle-strengthening exercises on 2 or more days a week.   Maintain a healthy weight. The body mass index (BMI) is a screening tool to identify possible weight problems. It provides an estimate of body fat based on height and weight. Your health care provider can find your BMI, and can help you achieve or maintain a healthy weight.For adults 20 years and older:   - A BMI below 18.5 is considered underweight.   - A BMI of 18.5 to 24.9 is normal.   - A BMI of 25 to 29.9 is  considered overweight.   - A BMI of 30 and above is considered obese.   Maintain normal blood lipids and cholesterol levels by exercising and minimizing your intake of trans and saturated fats.  Eat a balanced diet with plenty of fruit and vegetables. Blood tests for lipids and cholesterol should begin at age 20 and be repeated every 5 years minimum.  If your lipid or cholesterol levels are high, you are over 40, or you are at high risk for heart disease, you may need your cholesterol levels checked more frequently.Ongoing high lipid and cholesterol levels should be treated with medicines if diet and exercise are not working.   If you smoke, find out from your health care provider how to quit. If you do not use tobacco, do not start.   Lung cancer screening is recommended for adults aged 55-80 years who are at high risk for developing lung cancer because of a history of smoking. A yearly low-dose CT scan of the lungs is recommended for people who have at least a 30-pack-year history of smoking and are a current smoker or have quit within the past 15 years. A pack year of smoking is smoking an average of 1 pack of cigarettes a day for 1 year (for example: 1 pack a day for 30 years or 2 packs a day for 15 years). Yearly screening should continue until the smoker has stopped smoking for at least 15 years. Yearly screening should be stopped for people who develop a   health problem that would prevent them from having lung cancer treatment.   If you are pregnant, do not drink alcohol. If you are breastfeeding, be very cautious about drinking alcohol. If you are not pregnant and choose to drink alcohol, do not have more than 1 drink per day. One drink is considered to be 12 ounces (355 mL) of beer, 5 ounces (148 mL) of wine, or 1.5 ounces (44 mL) of liquor.   Avoid use of street drugs. Do not share needles with anyone. Ask for help if you need support or instructions about stopping the use of  drugs.   High blood pressure causes heart disease and increases the risk of stroke. Your blood pressure should be checked at least yearly.  Ongoing high blood pressure should be treated with medicines if weight loss and exercise do not work.   If you are 69-55 years old, ask your health care provider if you should take aspirin to prevent strokes.   Diabetes screening involves taking a blood sample to check your fasting blood sugar level. This should be done once every 3 years, after age 38, if you are within normal weight and without risk factors for diabetes. Testing should be considered at a younger age or be carried out more frequently if you are overweight and have at least 1 risk factor for diabetes.   Breast cancer screening is essential preventive care for women. You should practice "breast self-awareness."  This means understanding the normal appearance and feel of your breasts and may include breast self-examination.  Any changes detected, no matter how small, should be reported to a health care provider.  Women in their 80s and 30s should have a clinical breast exam (CBE) by a health care provider as part of a regular health exam every 1 to 3 years.  After age 66, women should have a CBE every year.  Starting at age 1, women should consider having a mammogram (breast X-ray test) every year.  Women who have a family history of breast cancer should talk to their health care provider about genetic screening.  Women at a high risk of breast cancer should talk to their health care providers about having an MRI and a mammogram every year.   -Breast cancer gene (BRCA)-related cancer risk assessment is recommended for women who have family members with BRCA-related cancers. BRCA-related cancers include breast, ovarian, tubal, and peritoneal cancers. Having family members with these cancers may be associated with an increased risk for harmful changes (mutations) in the breast cancer genes BRCA1 and  BRCA2. Results of the assessment will determine the need for genetic counseling and BRCA1 and BRCA2 testing.   The Pap test is a screening test for cervical cancer. A Pap test can show cell changes on the cervix that might become cervical cancer if left untreated. A Pap test is a procedure in which cells are obtained and examined from the lower end of the uterus (cervix).   - Women should have a Pap test starting at age 57.   - Between ages 90 and 70, Pap tests should be repeated every 2 years.   - Beginning at age 63, you should have a Pap test every 3 years as long as the past 3 Pap tests have been normal.   - Some women have medical problems that increase the chance of getting cervical cancer. Talk to your health care provider about these problems. It is especially important to talk to your health care provider if a  new problem develops soon after your last Pap test. In these cases, your health care provider may recommend more frequent screening and Pap tests.   - The above recommendations are the same for women who have or have not gotten the vaccine for human papillomavirus (HPV).   - If you had a hysterectomy for a problem that was not cancer or a condition that could lead to cancer, then you no longer need Pap tests. Even if you no longer need a Pap test, a regular exam is a good idea to make sure no other problems are starting.   - If you are between ages 36 and 66 years, and you have had normal Pap tests going back 10 years, you no longer need Pap tests. Even if you no longer need a Pap test, a regular exam is a good idea to make sure no other problems are starting.   - If you have had past treatment for cervical cancer or a condition that could lead to cancer, you need Pap tests and screening for cancer for at least 20 years after your treatment.   - If Pap tests have been discontinued, risk factors (such as a new sexual partner) need to be reassessed to determine if screening should  be resumed.   - The HPV test is an additional test that may be used for cervical cancer screening. The HPV test looks for the virus that can cause the cell changes on the cervix. The cells collected during the Pap test can be tested for HPV. The HPV test could be used to screen women aged 70 years and older, and should be used in women of any age who have unclear Pap test results. After the age of 67, women should have HPV testing at the same frequency as a Pap test.   Colorectal cancer can be detected and often prevented. Most routine colorectal cancer screening begins at the age of 57 years and continues through age 26 years. However, your health care provider may recommend screening at an earlier age if you have risk factors for colon cancer. On a yearly basis, your health care provider may provide home test kits to check for hidden blood in the stool.  Use of a small camera at the end of a tube, to directly examine the colon (sigmoidoscopy or colonoscopy), can detect the earliest forms of colorectal cancer. Talk to your health care provider about this at age 23, when routine screening begins. Direct exam of the colon should be repeated every 5 -10 years through age 49 years, unless early forms of pre-cancerous polyps or small growths are found.   People who are at an increased risk for hepatitis B should be screened for this virus. You are considered at high risk for hepatitis B if:  -You were born in a country where hepatitis B occurs often. Talk with your health care provider about which countries are considered high risk.  - Your parents were born in a high-risk country and you have not received a shot to protect against hepatitis B (hepatitis B vaccine).  - You have HIV or AIDS.  - You use needles to inject street drugs.  - You live with, or have sex with, someone who has Hepatitis B.  - You get hemodialysis treatment.  - You take certain medicines for conditions like cancer, organ  transplantation, and autoimmune conditions.   Hepatitis C blood testing is recommended for all people born from [birth year] through 1965 and any individual  with known risks for hepatitis C.   Practice safe sex. Use condoms and avoid high-risk sexual practices to reduce the spread of sexually transmitted infections (STIs). STIs include gonorrhea, chlamydia, syphilis, trichomonas, herpes, HPV, and human immunodeficiency virus (HIV). Herpes, HIV, and HPV are viral illnesses that have no cure. They can result in disability, cancer, and death. Sexually active women aged 25 years and younger should be checked for chlamydia. Older women with new or multiple partners should also be tested for chlamydia. Testing for other STIs is recommended if you are sexually active and at increased risk.   Osteoporosis is a disease in which the bones lose minerals and strength with aging. This can result in serious bone fractures or breaks. The risk of osteoporosis can be identified using a bone density scan. Women ages 65 years and over and women at risk for fractures or osteoporosis should discuss screening with their health care providers. Ask your health care provider whether you should take a calcium supplement or vitamin D to There are also several preventive steps women can take to avoid osteoporosis and resulting fractures or to keep osteoporosis from worsening. -->Recommendations include:  Eat a balanced diet high in fruits, vegetables, calcium, and vitamins.  Get enough calcium. The recommended total intake of is 1,200 mg daily; for best absorption, if taking supplements, divide doses into 250-500 mg doses throughout the day. Of the two types of calcium, calcium carbonate is best absorbed when taken with food but calcium citrate can be taken on an empty stomach.  Get enough vitamin D. NAMS and the National Osteoporosis Foundation recommend at least 1,000 IU per day for women age 50 and over who are at risk of vitamin D  deficiency. Vitamin D deficiency can be caused by inadequate sun exposure (for example, those who live in northern latitudes).  Avoid alcohol and smoking. Heavy alcohol intake (more than 7 drinks per week) increases the risk of falls and hip fracture and women smokers tend to lose bone more rapidly and have lower bone mass than nonsmokers. Stopping smoking is one of the most important changes women can make to improve their health and decrease risk for disease.  Be physically active every day. Weight-bearing exercise (for example, fast walking, hiking, jogging, and weight training) may strengthen bones or slow the rate of bone loss that comes with aging. Balancing and muscle-strengthening exercises can reduce the risk of falling and fracture.  Consider therapeutic medications. Currently, several types of effective drugs are available. Healthcare providers can recommend the type most appropriate for each woman.  Eliminate environmental factors that may contribute to accidents. Falls cause nearly 90% of all osteoporotic fractures, so reducing this risk is an important bone-health strategy. Measures include ample lighting, removing obstructions to walking, using nonskid rugs on floors, and placing mats and/or grab bars in showers.  Be aware of medication side effects. Some common medicines make bones weaker. These include a type of steroid drug called glucocorticoids used for arthritis and asthma, some antiseizure drugs, certain sleeping pills, treatments for endometriosis, and some cancer drugs. An overactive thyroid gland or using too much thyroid hormone for an underactive thyroid can also be a problem. If you are taking these medicines, talk to your doctor about what you can do to help protect your bones.reduce the rate of osteoporosis.    Menopause can be associated with physical symptoms and risks. Hormone replacement therapy is available to decrease symptoms and risks. You should talk to your  health care provider   about whether hormone replacement therapy is right for you.   Use sunscreen. Apply sunscreen liberally and repeatedly throughout the day. You should seek shade when your shadow is shorter than you. Protect yourself by wearing long sleeves, pants, a wide-brimmed hat, and sunglasses year round, whenever you are outdoors.   Once a month, do a whole body skin exam, using a mirror to look at the skin on your back. Tell your health care provider of new moles, moles that have irregular borders, moles that are larger than a pencil eraser, or moles that have changed in shape or color.   -Stay current with required vaccines (immunizations).   Influenza vaccine. All adults should be immunized every year.  Tetanus, diphtheria, and acellular pertussis (Td, Tdap) vaccine. Pregnant women should receive 1 dose of Tdap vaccine during each pregnancy. The dose should be obtained regardless of the length of time since the last dose. Immunization is preferred during the 27th 36th week of gestation. An adult who has not previously received Tdap or who does not know her vaccine status should receive 1 dose of Tdap. This initial dose should be followed by tetanus and diphtheria toxoids (Td) booster doses every 10 years. Adults with an unknown or incomplete history of completing a 3-dose immunization series with Td-containing vaccines should begin or complete a primary immunization series including a Tdap dose. Adults should receive a Td booster every 10 years.  Varicella vaccine. An adult without evidence of immunity to varicella should receive 2 doses or a second dose if she has previously received 1 dose. Pregnant females who do not have evidence of immunity should receive the first dose after pregnancy. This first dose should be obtained before leaving the health care facility. The second dose should be obtained 4 8 weeks after the first dose.  Human papillomavirus (HPV) vaccine. Females aged 13 26  years who have not received the vaccine previously should obtain the 3-dose series. The vaccine is not recommended for use in pregnant females. However, pregnancy testing is not needed before receiving a dose. If a female is found to be pregnant after receiving a dose, no treatment is needed. In that case, the remaining doses should be delayed until after the pregnancy. Immunization is recommended for any person with an immunocompromised condition through the age of 26 years if she did not get any or all doses earlier. During the 3-dose series, the second dose should be obtained 4 8 weeks after the first dose. The third dose should be obtained 24 weeks after the first dose and 16 weeks after the second dose.  Zoster vaccine. One dose is recommended for adults aged 60 years or older unless certain conditions are present.  Measles, mumps, and rubella (MMR) vaccine. Adults born before 1957 generally are considered immune to measles and mumps. Adults born in 1957 or later should have 1 or more doses of MMR vaccine unless there is a contraindication to the vaccine or there is laboratory evidence of immunity to each of the three diseases. A routine second dose of MMR vaccine should be obtained at least 28 days after the first dose for students attending postsecondary schools, health care workers, or international travelers. People who received inactivated measles vaccine or an unknown type of measles vaccine during 1963 1967 should receive 2 doses of MMR vaccine. People who received inactivated mumps vaccine or an unknown type of mumps vaccine before 1979 and are at high risk for mumps infection should consider immunization with 2 doses of   MMR vaccine. For females of childbearing age, rubella immunity should be determined. If there is no evidence of immunity, females who are not pregnant should be vaccinated. If there is no evidence of immunity, females who are pregnant should delay immunization until after pregnancy.  Unvaccinated health care workers born before [birth year] who lack laboratory evidence of measles, mumps, or rubella immunity or laboratory confirmation of disease should consider measles and mumps immunization with 2 doses of MMR vaccine or rubella immunization with 1 dose of MMR vaccine.  Pneumococcal 13-valent conjugate (PCV13) vaccine. When indicated, a person who is uncertain of her immunization history and has no record of immunization should receive the PCV13 vaccine. An adult aged 54 years or older who has certain medical conditions and has not been previously immunized should receive 1 dose of PCV13 vaccine. This PCV13 should be followed with a dose of pneumococcal polysaccharide (PPSV23) vaccine. The PPSV23 vaccine dose should be obtained at least 8 weeks after the dose of PCV13 vaccine. An adult aged 58 years or older who has certain medical conditions and previously received 1 or more doses of PPSV23 vaccine should receive 1 dose of PCV13. The PCV13 vaccine dose should be obtained 1 or more years after the last PPSV23 vaccine dose.  Pneumococcal polysaccharide (PPSV23) vaccine. When PCV13 is also indicated, PCV13 should be obtained first. All adults aged 58 years and older should be immunized. An adult younger than age 65 years who has certain medical conditions should be immunized. Any person who resides in a nursing home or long-term care facility should be immunized. An adult smoker should be immunized. People with an immunocompromised condition and certain other conditions should receive both PCV13 and PPSV23 vaccines. People with human immunodeficiency virus (HIV) infection should be immunized as soon as possible after diagnosis. Immunization during chemotherapy or radiation therapy should be avoided. Routine use of PPSV23 vaccine is not recommended for American Indians, [place] Natives, or people younger than 65 years unless there are medical conditions that require PPSV23 vaccine. When indicated,  people who have unknown immunization and have no record of immunization should receive PPSV23 vaccine. One-time revaccination 5 years after the first dose of PPSV23 is recommended for people aged 70 64 years who have chronic kidney failure, nephrotic syndrome, asplenia, or immunocompromised conditions. People who received 1 2 doses of PPSV23 before age 32 years should receive another dose of PPSV23 vaccine at age 96 years or later if at least 5 years have passed since the previous dose. Doses of PPSV23 are not needed for people immunized with PPSV23 at or after age 55 years.  Meningococcal vaccine. Adults with asplenia or persistent complement component deficiencies should receive 2 doses of quadrivalent meningococcal conjugate (MenACWY-D) vaccine. The doses should be obtained at least 2 months apart. Microbiologists working with certain meningococcal bacteria, [employer] recruits, people at risk during an outbreak, and people who travel to or live in countries with a high rate of meningitis should be immunized. A first-year college student up through age 58 years who is living in a residence hall should receive a dose if she did not receive a dose on or after her 16th birthday. Adults who have certain high-risk conditions should receive one or more doses of vaccine.  Hepatitis A vaccine. Adults who wish to be protected from this disease, have certain high-risk conditions, work with hepatitis A-infected animals, work in hepatitis A research labs, or travel to or work in countries with a high rate of hepatitis A should be  immunized. Adults who were previously unvaccinated and who anticipate close contact with an international adoptee during the first 60 days after arrival in the [place] from a country with a high rate of hepatitis A should be immunized.  Hepatitis B vaccine.  Adults who wish to be protected from this disease, have certain high-risk conditions, may be exposed to blood or other infectious  body fluids, are household contacts or sex partners of hepatitis B positive people, are clients or workers in certain care facilities, or travel to or work in countries with a high rate of hepatitis B should be immunized.  Haemophilus influenzae type b (Hib) vaccine. A previously unvaccinated person with asplenia or sickle cell disease or having a scheduled splenectomy should receive 1 dose of Hib vaccine. Regardless of previous immunization, a recipient of a hematopoietic stem cell transplant should receive a 3-dose series 6 12 months after her successful transplant. Hib vaccine is not recommended for adults with HIV infection.  Preventive Services / Frequency Ages 6 to 39years  Blood pressure check.** / Every 1 to 2 years.  Lipid and cholesterol check.** / Every 5 years beginning at age 39.  Clinical breast exam.** / Every 3 years for women in their 61s and 62s.  BRCA-related cancer risk assessment.** / For women who have family members with a BRCA-related cancer (breast, ovarian, tubal, or peritoneal cancers).  Pap test.** / Every 2 years from ages 47 through 85. Every 3 years starting at age 34 through age 12 or 74 with a history of 3 consecutive normal Pap tests.  HPV screening.** / Every 3 years from ages 46 through ages 43 to 54 with a history of 3 consecutive normal Pap tests.  Hepatitis C blood test.** / For any individual with known risks for hepatitis C.  Skin self-exam. / Monthly.  Influenza vaccine. / Every year.  Tetanus, diphtheria, and acellular pertussis (Tdap, Td) vaccine.** / Consult your health care provider. Pregnant women should receive 1 dose of Tdap vaccine during each pregnancy. 1 dose of Td every 10 years.  Varicella vaccine.** / Consult your health care provider. Pregnant females who do not have evidence of immunity should receive the first dose after pregnancy.  HPV vaccine. / 3 doses over 6 months, if 64 and younger. The vaccine is not recommended for use in  pregnant females. However, pregnancy testing is not needed before receiving a dose.  Measles, mumps, rubella (MMR) vaccine.** / You need at least 1 dose of MMR if you were born in 1957 or later. You may also need a 2nd dose. For females of childbearing age, rubella immunity should be determined. If there is no evidence of immunity, females who are not pregnant should be vaccinated. If there is no evidence of immunity, females who are pregnant should delay immunization until after pregnancy.  Pneumococcal 13-valent conjugate (PCV13) vaccine.** / Consult your health care provider.  Pneumococcal polysaccharide (PPSV23) vaccine.** / 1 to 2 doses if you smoke cigarettes or if you have certain conditions.  Meningococcal vaccine.** / 1 dose if you are age 71 to 37 years and a Market researcher living in a residence hall, or have one of several medical conditions, you need to get vaccinated against meningococcal disease. You may also need additional booster doses.  Hepatitis A vaccine.** / Consult your health care provider.  Hepatitis B vaccine.** / Consult your health care provider.  Haemophilus influenzae type b (Hib) vaccine.** / Consult your health care provider.  Ages 55 to 64years  Blood pressure check.** / Every 1 to 2 years.  Lipid and cholesterol check.** / Every 5 years beginning at age 20 years.  Lung cancer screening. / Every year if you are aged 55 80 years and have a 30-pack-year history of smoking and currently smoke or have quit within the past 15 years. Yearly screening is stopped once you have quit smoking for at least 15 years or develop a health problem that would prevent you from having lung cancer treatment.  Clinical breast exam.** / Every year after age 40 years.  BRCA-related cancer risk assessment.** / For women who have family members with a BRCA-related cancer (breast, ovarian, tubal, or peritoneal cancers).  Mammogram.** / Every year beginning at age 40  years and continuing for as long as you are in good health. Consult with your health care provider.  Pap test.** / Every 3 years starting at age 30 years through age 65 or 70 years with a history of 3 consecutive normal Pap tests.  HPV screening.** / Every 3 years from ages 30 years through ages 65 to 70 years with a history of 3 consecutive normal Pap tests.  Fecal occult blood test (FOBT) of stool. / Every year beginning at age 50 years and continuing until age 75 years. You may not need to do this test if you get a colonoscopy every 10 years.  Flexible sigmoidoscopy or colonoscopy.** / Every 5 years for a flexible sigmoidoscopy or every 10 years for a colonoscopy beginning at age 50 years and continuing until age 75 years.  Hepatitis C blood test.** / For all people born from 1945 through 1965 and any individual with known risks for hepatitis C.  Skin self-exam. / Monthly.  Influenza vaccine. / Every year.  Tetanus, diphtheria, and acellular pertussis (Tdap/Td) vaccine.** / Consult your health care provider. Pregnant women should receive 1 dose of Tdap vaccine during each pregnancy. 1 dose of Td every 10 years.  Varicella vaccine.** / Consult your health care provider. Pregnant females who do not have evidence of immunity should receive the first dose after pregnancy.  Zoster vaccine.** / 1 dose for adults aged 60 years or older.  Measles, mumps, rubella (MMR) vaccine.** / You need at least 1 dose of MMR if you were born in 1957 or later. You may also need a 2nd dose. For females of childbearing age, rubella immunity should be determined. If there is no evidence of immunity, females who are not pregnant should be vaccinated. If there is no evidence of immunity, females who are pregnant should delay immunization until after pregnancy.  Pneumococcal 13-valent conjugate (PCV13) vaccine.** / Consult your health care provider.  Pneumococcal polysaccharide (PPSV23) vaccine.** / 1 to 2 doses if  you smoke cigarettes or if you have certain conditions.  Meningococcal vaccine.** / Consult your health care provider.  Hepatitis A vaccine.** / Consult your health care provider.  Hepatitis B vaccine.** / Consult your health care provider.  Haemophilus influenzae type b (Hib) vaccine.** / Consult your health care provider.  Ages 65 years and over  Blood pressure check.** / Every 1 to 2 years.  Lipid and cholesterol check.** / Every 5 years beginning at age 20 years.  Lung cancer screening. / Every year if you are aged 55 80 years and have a 30-pack-year history of smoking and currently smoke or have quit within the past 15 years. Yearly screening is stopped once you have quit smoking for at least 15 years or develop a health problem that   would prevent you from having lung cancer treatment.  Clinical breast exam.** / Every year after age 103 years.  BRCA-related cancer risk assessment.** / For women who have family members with a BRCA-related cancer (breast, ovarian, tubal, or peritoneal cancers).  Mammogram.** / Every year beginning at age 36 years and continuing for as long as you are in good health. Consult with your health care provider.  Pap test.** / Every 3 years starting at age 5 years through age 85 or 10 years with 3 consecutive normal Pap tests. Testing can be stopped between 65 and 70 years with 3 consecutive normal Pap tests and no abnormal Pap or HPV tests in the past 10 years.  HPV screening.** / Every 3 years from ages 93 years through ages 70 or 45 years with a history of 3 consecutive normal Pap tests. Testing can be stopped between 65 and 70 years with 3 consecutive normal Pap tests and no abnormal Pap or HPV tests in the past 10 years.  Fecal occult blood test (FOBT) of stool. / Every year beginning at age 8 years and continuing until age 45 years. You may not need to do this test if you get a colonoscopy every 10 years.  Flexible sigmoidoscopy or colonoscopy.** /  Every 5 years for a flexible sigmoidoscopy or every 10 years for a colonoscopy beginning at age 69 years and continuing until age 68 years.  Hepatitis C blood test.** / For all people born from [birth year] through 1964 and any individual with known risks for hepatitis C.  Osteoporosis screening.** / A one-time screening for women ages 7 years and over and women at risk for fractures or osteoporosis.  Skin self-exam. / Monthly.  Influenza vaccine. / Every year.  Tetanus, diphtheria, and acellular pertussis (Tdap/Td) vaccine.** / 1 dose of Td every 10 years.  Varicella vaccine.** / Consult your health care provider.  Zoster vaccine.** / 1 dose for adults aged 5 years or older.  Pneumococcal 13-valent conjugate (PCV13) vaccine.** / Consult your health care provider.  Pneumococcal polysaccharide (PPSV23) vaccine.** / 1 dose for all adults aged 74 years and older.  Meningococcal vaccine.** / Consult your health care provider.  Hepatitis A vaccine.** / Consult your health care provider.  Hepatitis B vaccine.** / Consult your health care provider.  Haemophilus influenzae type b (Hib) vaccine.** / Consult your health care provider. ** Family history and personal history of risk and conditions may change your health care provider's recommendations. Document Released: 05/25/2001 Document Revised: 01/17/2013  [facility] Patient Information 2014 [place], [place].   EXERCISE AND DIET:  We recommended that you start or continue a regular exercise program for good health. Regular exercise means any activity that makes your heart beat faster and makes you sweat.  We recommend exercising at least 30 minutes per day at least 3 days a week, preferably 5.  We also recommend a diet low in fat and sugar / carbohydrates.  Inactivity, poor dietary choices and obesity can cause diabetes, heart attack, stroke, and kidney damage, among others.     ALCOHOL AND SMOKING:  Women should limit their alcohol intake to no  more than 7 drinks/beers/glasses of wine (combined, not each!) per week. Moderation of alcohol intake to this level decreases your risk of breast cancer and liver damage.  ( And of course, no recreational drugs are part of a healthy lifestyle.)  Also, you should not be smoking at all or even being exposed to second hand smoke. Most people know smoking can  cause cancer, and various heart and lung diseases, but did you know it also contributes to weakening of your bones?  Aging of your skin?  Yellowing of your teeth and nails?   CALCIUM AND VITAMIN D:  Adequate intake of calcium and Vitamin D are recommended.  The recommendations for exact amounts of these supplements seem to change often, but generally speaking 600 mg of calcium (either carbonate or citrate) and 800 units of Vitamin D per day seems prudent. Certain women may benefit from higher intake of Vitamin D.  If you are among these women, your doctor will have told you during your visit.     PAP SMEARS:  Pap smears, to check for cervical cancer or precancers,  have traditionally been done yearly, although recent scientific advances have shown that most women can have pap smears less often.  However, every woman still should have a physical exam from her gynecologist or primary care physician every year. It will include a breast check, inspection of the vulva and vagina to check for abnormal growths or skin changes, a visual exam of the cervix, and then an exam to evaluate the size and shape of the uterus and ovaries.  And after 57 years of age, a rectal exam is indicated to check for rectal cancers. We will also provide age appropriate advice regarding health maintenance, like when you should have certain vaccines, screening for sexually transmitted diseases, bone density testing, colonoscopy, mammograms, etc.    MAMMOGRAMS:  All women over 13 years old should have a yearly mammogram. Many facilities now offer a "3D" mammogram, which may cost  around $50 extra out of pocket. If possible,  we recommend you accept the option to have the 3D mammogram performed.  It both reduces the number of women who will be called back for extra views which then turn out to be normal, and it is better than the routine mammogram at detecting truly abnormal areas.     COLONOSCOPY:  Colonoscopy to screen for colon cancer is recommended for all women at age 70.  We know, you hate the idea of the prep.  We agree, BUT, having colon cancer and not knowing it is worse!!  Colon cancer so often starts as a polyp that can be seen and removed at colonscopy, which can quite literally save your life!  And if your first colonoscopy is normal and you have no family history of colon cancer, most women don't have to have it again for 10 years.  Once every ten years, you can do something that may end up saving your life, right?  We will be happy to help you get it scheduled when you are ready.  Be sure to check your insurance coverage so you understand how much it will cost.  It may be covered as a preventative service at no cost, but you should check your particular policy.    Great job on weight loss. Remain well hydrated, follow heart healthy diet, remain as active as possible. Lab work is stable, recommend repeating A1c at follow-up in 6 months. Continue all medications as directed. Continue close follow-up with Dermatologist. Continue to social distance and wear a mask when in public. Follow-up in 6 months. GREAT TO SEE YOU!

## 2018-12-12 NOTE — Assessment & Plan Note (Signed)
  Great job on weight loss. Remain well hydrated, follow heart healthy diet, remain as active as possible. Lab work is stable, recommend repeating A1c at follow-up in 6 months. Continue all medications as directed. Continue close follow-up with Dermatologist. Continue to social distance and wear a mask when in public. Follow-up in 6 months.

## 2018-12-12 NOTE — Assessment & Plan Note (Signed)
Stable

## 2018-12-22 ENCOUNTER — Ambulatory Visit: Payer: BC Managed Care – PPO | Admitting: Podiatry

## 2018-12-29 ENCOUNTER — Other Ambulatory Visit: Payer: Self-pay

## 2018-12-29 ENCOUNTER — Ambulatory Visit (INDEPENDENT_AMBULATORY_CARE_PROVIDER_SITE_OTHER): Payer: BC Managed Care – PPO

## 2018-12-29 ENCOUNTER — Encounter: Payer: Self-pay | Admitting: Podiatry

## 2018-12-29 ENCOUNTER — Ambulatory Visit: Payer: BC Managed Care – PPO | Admitting: Podiatry

## 2018-12-29 VITALS — BP 142/81 | HR 68 | Resp 16

## 2018-12-29 DIAGNOSIS — M2011 Hallux valgus (acquired), right foot: Secondary | ICD-10-CM

## 2018-12-29 DIAGNOSIS — Q66229 Congenital metatarsus adductus, unspecified foot: Secondary | ICD-10-CM

## 2018-12-29 MED ORDER — METHYLPREDNISOLONE 4 MG PO TBPK: ORAL_TABLET | ORAL | 0 refills | Status: DC

## 2018-12-29 NOTE — Patient Instructions (Signed)
If was nice to meet you today. If you have any questions or any further concerns, please feel fee to give me a call. You can call our office at [phone] or please feel fee to send me a message through MyChart.   

## 2018-12-29 NOTE — Progress Notes (Signed)
Subjective:   Patient ID: Diamond Trujillo, female   DOB: 58 y.o.   MRN: [id number]   HPI 58 year old female presents the office today for concerns of pain to the right first MPJ.  She said that she has had a bunion for several years over the last couple months it has been more painful and she is noticing swelling or redness to the toe joint itself.  She states that shoes are uncomfortable.  She said the second is also starting to hurt and is going into the midfoot area.  She is tried meloxicam previously without any help.  No recent injury or falls.   Review of Systems  All other systems reviewed and are negative.  Past Medical History:  Diagnosis Date  . Anxiety   . Cancer ([place])    melanoma  . Celiac disease   . Hiatal hernia   . Hypertension   . MVP (mitral valve prolapse)     Past Surgical History:  Procedure Laterality Date  . CESAREAN SECTION     x 3  . MELANOMA EXCISION    . MENISCUS REPAIR Right   . TRIGGER FINGER RELEASE     Left Thumb      Current Outpatient Medications:  .  albuterol (VENTOLIN HFA) 108 (90 Base) MCG/ACT inhaler, Inhale 2 puffs into the lungs every 6 (six) hours as needed for wheezing or shortness of breath., Disp: 1 Inhaler, Rfl: 1 .  ALPRAZolam (XANAX) 0.25 MG tablet, Take 0.25 mg by mouth. Take 1 tablet prior to flying, Disp: , Rfl:  .  esomeprazole (NEXIUM) 20 MG packet, Take 20 mg by mouth daily before breakfast., Disp: , Rfl:  .  losartan (COZAAR) 100 MG tablet, Take 1 tablet (100 mg total) by mouth daily., Disp: 90 tablet, Rfl: 2 .  meloxicam (MOBIC) 15 MG tablet, Take 15 mg by mouth daily as needed for pain., Disp: , Rfl:  .  methylPREDNISolone (MEDROL DOSEPAK) 4 MG TBPK tablet, Take as directed, Disp: 21 tablet, Rfl: 0  Allergies  Allergen Reactions  . Gluten Meal Other (See Comments)    Vomiting, anorexia          Objective:  Physical Exam  General: AAO x3, NAD  Dermatological: Skin is warm, dry and supple bilateral.There are no  open sores, no preulcerative lesions, no rash or signs of infection present.  Vascular: Dorsalis Pedis artery and Posterior Tibial artery pedal pulses are 2/4 bilateral with immedate capillary fill time. There is no pain with calf compression, swelling, warmth, erythema.   Neruologic: Grossly intact via light touch bilateral. Protective threshold with Semmes Wienstein monofilament intact to all pedal sites bilateral.   Musculoskeletal: Bunion deformities present on the right foot and there is localized edema and mild erythema to the right first IPJ but this seems to be more from inflammation as opposed to infection.  Muscular strength 5/5 in all groups tested bilateral.  Gait: Unassisted, Nonantalgic.       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  Bunion deformities present present there is also met adductus present of the second third metatarsals.  No evidence of acute fracture. -Discussed both conservative as well as surgical treatment options.  She was to hold off on surgical intervention.  Ultimately discussed shoe modifications and possibly orthotics.  Discussed heel injection  if needed.  Prescribed a Medrol Dosepak.  Trula Slade DPM

## 2019-01-29 ENCOUNTER — Ambulatory Visit: Payer: BC Managed Care – PPO | Admitting: Podiatry

## 2019-01-29 ENCOUNTER — Other Ambulatory Visit: Payer: Self-pay

## 2019-01-29 ENCOUNTER — Encounter: Payer: Self-pay | Admitting: Podiatry

## 2019-01-29 ENCOUNTER — Ambulatory Visit (INDEPENDENT_AMBULATORY_CARE_PROVIDER_SITE_OTHER): Payer: BC Managed Care – PPO | Admitting: Orthotics

## 2019-01-29 DIAGNOSIS — M722 Plantar fascial fibromatosis: Secondary | ICD-10-CM

## 2019-01-29 DIAGNOSIS — M1990 Unspecified osteoarthritis, unspecified site: Secondary | ICD-10-CM

## 2019-01-29 DIAGNOSIS — M779 Enthesopathy, unspecified: Secondary | ICD-10-CM

## 2019-01-29 DIAGNOSIS — M2011 Hallux valgus (acquired), right foot: Secondary | ICD-10-CM

## 2019-01-29 DIAGNOSIS — Q66229 Congenital metatarsus adductus, unspecified foot: Secondary | ICD-10-CM

## 2019-01-29 NOTE — Progress Notes (Signed)
Patient had pain upon palpation 2nd mpj.  Plan on f/o with goo arch support, offloading 2nd MPJ right, met head drop out first to encourage better windlass mechanism first ray.  RIchy to fab.

## 2019-01-30 NOTE — Progress Notes (Signed)
Subjective: 58 year old female presents the office for follow evaluation of right foot pain.  She states that the prednisone pack did help but temporarily.  She is scheduled for orthotics today.  Today she also describes general joint pain and inflammation.  She states that when she was on steroids it helped her whole body with general body inflammation.  She states that she thinks that she has arthritis but she is never been tested for systemic underlying arthritis. Denies any systemic complaints such as fevers, chills, nausea, vomiting. No acute changes since last appointment, and no other complaints at this time.   Objective: AAO x3, NAD DP/PT pulses palpable bilaterally, CRT less than 3 seconds Upon weightbearing evaluation 1 normal foot type is present the left side.  The right side met adductus foot type is present.  Mild tenderness on the first MPJ with mild erythema on the medial half of the first metatarsal.  Occasional tenderness on the course of the plantar fascia. No pain with calf compression, swelling, warmth, erythema  Assessment: Metatarsus adductus right foot, bunion deformity, capsulitis  Plan: -All treatment options discussed with the patient including all alternatives, risks, complications.  -Regards to her multiple joint pain as well as chronic foot pain will check arthritic panel.  I have ordered rheumatoid factor, ANA, ESR, CRP, HLA-B27 -She was measured for orthotics with Liliane Channel. -Discussed steroid injection.  Will consider next appointment if needed. -Patient encouraged to call the office with any questions, concerns, change in symptoms.   Trula Slade DPM

## 2019-02-02 DIAGNOSIS — M1990 Unspecified osteoarthritis, unspecified site: Secondary | ICD-10-CM | POA: Diagnosis not present

## 2019-02-05 LAB — C-REACTIVE PROTEIN: CRP: 4.5 mg/L (ref <8.0)

## 2019-02-05 LAB — SEDIMENTATION RATE: Sed Rate: 9 mm/h (ref 0–30)

## 2019-02-05 LAB — ANA: Anti Nuclear Antibody (ANA): NEGATIVE

## 2019-02-05 LAB — RHEUMATOID FACTOR: Rheumatoid fact SerPl-aCnc: <14 IU/mL (ref <14)

## 2019-02-05 LAB — HLA-B27 ANTIGEN: HLA-B27 Antigen: NEGATIVE

## 2019-02-09 ENCOUNTER — Telehealth: Payer: Self-pay | Admitting: *Deleted

## 2019-02-09 NOTE — Telephone Encounter (Signed)
I informed pt of Dr. Wagoner's review of results. 

## 2019-02-09 NOTE — Telephone Encounter (Signed)
-----   Message from Trula Slade, DPM sent at 02/08/2019  7:12 AM EDT ----- Val- please let her know that the blood work was all normal. Thanks.

## 2019-02-09 NOTE — Telephone Encounter (Signed)
Left message requesting call to discuss results.

## 2019-02-09 NOTE — Telephone Encounter (Signed)
Pt called for results.

## 2019-02-12 DIAGNOSIS — D3132 Benign neoplasm of left choroid: Secondary | ICD-10-CM | POA: Diagnosis not present

## 2019-02-12 DIAGNOSIS — Q141 Congenital malformation of retina: Secondary | ICD-10-CM | POA: Diagnosis not present

## 2019-02-12 DIAGNOSIS — H524 Presbyopia: Secondary | ICD-10-CM | POA: Diagnosis not present

## 2019-02-12 DIAGNOSIS — H2513 Age-related nuclear cataract, bilateral: Secondary | ICD-10-CM | POA: Diagnosis not present

## 2019-02-12 DIAGNOSIS — H35363 Drusen (degenerative) of macula, bilateral: Secondary | ICD-10-CM | POA: Diagnosis not present

## 2019-02-27 ENCOUNTER — Ambulatory Visit: Payer: BC Managed Care – PPO | Admitting: Orthotics

## 2019-02-27 ENCOUNTER — Other Ambulatory Visit: Payer: Self-pay

## 2019-02-27 DIAGNOSIS — M2011 Hallux valgus (acquired), right foot: Secondary | ICD-10-CM

## 2019-02-27 DIAGNOSIS — Q66229 Congenital metatarsus adductus, unspecified foot: Secondary | ICD-10-CM

## 2019-02-27 DIAGNOSIS — M1990 Unspecified osteoarthritis, unspecified site: Secondary | ICD-10-CM

## 2019-02-27 NOTE — Progress Notes (Signed)
Patient came in today to pick up custom made foot orthotics.  The goals were accomplished and the patient reported no dissatisfaction with said orthotics.  Patient was advised of breakin period and how to report any issues. 

## 2019-05-29 ENCOUNTER — Other Ambulatory Visit: Payer: Self-pay | Admitting: Adult Health

## 2019-06-29 ENCOUNTER — Encounter: Payer: Self-pay | Admitting: Family Medicine

## 2019-06-29 ENCOUNTER — Other Ambulatory Visit: Payer: Self-pay

## 2019-06-29 ENCOUNTER — Ambulatory Visit: Payer: 59 | Admitting: Family Medicine

## 2019-06-29 VITALS — BP 128/79 | HR 88 | Temp 97.7°F | Resp 10 | Ht 66.0 in | Wt 193.0 lb

## 2019-06-29 DIAGNOSIS — E669 Obesity, unspecified: Secondary | ICD-10-CM

## 2019-06-29 DIAGNOSIS — E7841 Elevated Lipoprotein(a): Secondary | ICD-10-CM

## 2019-06-29 DIAGNOSIS — F418 Other specified anxiety disorders: Secondary | ICD-10-CM

## 2019-06-29 DIAGNOSIS — I10 Essential (primary) hypertension: Secondary | ICD-10-CM

## 2019-06-29 MED ORDER — LOSARTAN POTASSIUM 100 MG PO TABS: 100.0000 mg | ORAL_TABLET | Freq: Every day | ORAL | 0 refills | Status: DC

## 2019-06-29 NOTE — Patient Instructions (Addendum)
OMRON Series 3 upper arm blood pressure cuff.   Your goal blood pressure should be 135/85 or less on a regular basis, her medications should be started.  Normal blood pressure is 120/80 or less.    Hypertension Hypertension, commonly called high blood pressure, is when the force of blood pumping through the arteries is too strong. The arteries are the blood vessels that carry blood from the heart throughout the body. Hypertension forces the heart to work harder to pump blood and may cause arteries to become narrow or stiff. Having untreated or uncontrolled hypertension can cause heart attacks, strokes, kidney disease, and other problems. A blood pressure reading consists of a higher number over a lower number. Ideally, your blood pressure should be below 120/80. The first ("top") number is called the systolic pressure. It is a measure of the pressure in your arteries as your heart beats. The second ("bottom") number is called the diastolic pressure. It is a measure of the pressure in your arteries as the heart relaxes. What are the causes? The cause of this condition is not known. What increases the risk? Some risk factors for high blood pressure are under your control. Others are not. Factors you can change  Smoking.  Having type 2 diabetes mellitus, high cholesterol, or both.  Not getting enough exercise or physical activity.  Being overweight.  Having too much fat, sugar, calories, or salt (sodium) in your diet.  Drinking too much alcohol. Factors that are difficult or impossible to change  Having chronic kidney disease.  Having a family history of high blood pressure.  Age. Risk increases with age.  Race. You may be at higher risk if you are African-American.  Gender. Men are at higher risk than women before age 68. After age 55, women are at higher risk than men.  Having obstructive sleep apnea.  Stress. What are the signs or symptoms? Extremely high blood pressure  (hypertensive crisis) may cause:  Headache.  Anxiety.  Shortness of breath.  Nosebleed.  Nausea and vomiting.  Severe chest pain.  Jerky movements you cannot control (seizures).  How is this diagnosed? This condition is diagnosed by measuring your blood pressure while you are seated, with your arm resting on a surface. The cuff of the blood pressure monitor will be placed directly against the skin of your upper arm at the level of your heart. It should be measured at least twice using the same arm. Certain conditions can cause a difference in blood pressure between your right and left arms. Certain factors can cause blood pressure readings to be lower or higher than normal (elevated) for a short period of time:  When your blood pressure is higher when you are in a health care provider's office than when you are at home, this is called white coat hypertension. Most people with this condition do not need medicines.  When your blood pressure is higher at home than when you are in a health care provider's office, this is called masked hypertension. Most people with this condition may need medicines to control blood pressure.  If you have a high blood pressure reading during one visit or you have normal blood pressure with other risk factors:  You may be asked to return on a different day to have your blood pressure checked again.  You may be asked to monitor your blood pressure at home for 1 week or longer.  If you are diagnosed with hypertension, you may have other blood or imaging tests  to help your health care provider understand your overall risk for other conditions. How is this treated? This condition is treated by making healthy lifestyle changes, such as eating healthy foods, exercising more, and reducing your alcohol intake. Your health care provider may prescribe medicine if lifestyle changes are not enough to get your blood pressure under control, and if:  Your systolic blood  pressure is above 130.  Your diastolic blood pressure is above 80.  Your personal target blood pressure may vary depending on your medical conditions, your age, and other factors. Follow these instructions at home: Eating and drinking  Eat a diet that is high in fiber and potassium, and low in sodium, added sugar, and fat. An example eating plan is called the DASH (Dietary Approaches to Stop Hypertension) diet. To eat this way: ? Eat plenty of fresh fruits and vegetables. Try to fill half of your plate at each meal with fruits and vegetables. ? Eat whole grains, such as whole wheat pasta, brown rice, or whole grain bread. Fill about one quarter of your plate with whole grains. ? Eat or drink low-fat dairy products, such as skim milk or low-fat yogurt. ? Avoid fatty cuts of meat, processed or cured meats, and poultry with skin. Fill about one quarter of your plate with lean proteins, such as fish, chicken without skin, beans, eggs, and tofu. ? Avoid premade and processed foods. These tend to be higher in sodium, added sugar, and fat.  Reduce your daily sodium intake. Most people with hypertension should eat less than 1,500 mg of sodium a day.  Limit alcohol intake to no more than 1 drink a day for nonpregnant women and 2 drinks a day for men. One drink equals 12 oz of beer, 5 oz of wine, or 1 oz of hard liquor. Lifestyle  Work with your health care provider to maintain a healthy body weight or to lose weight. Ask what an ideal weight is for you.  Get at least 30 minutes of exercise that causes your heart to beat faster (aerobic exercise) most days of the week. Activities may include walking, swimming, or biking.  Include exercise to strengthen your muscles (resistance exercise), such as pilates or lifting weights, as part of your weekly exercise routine. Try to do these types of exercises for 30 minutes at least 3 days a week.  Do not use any products that contain nicotine or tobacco, such  as cigarettes and e-cigarettes. If you need help quitting, ask your health care provider.  Monitor your blood pressure at home as told by your health care provider.  Keep all follow-up visits as told by your health care provider. This is important. Medicines  Take over-the-counter and prescription medicines only as told by your health care provider. Follow directions carefully. Blood pressure medicines must be taken as prescribed.  Do not skip doses of blood pressure medicine. Doing this puts you at risk for problems and can make the medicine less effective.  Ask your health care provider about side effects or reactions to medicines that you should watch for. Contact a health care provider if:  You think you are having a reaction to a medicine you are taking.  You have headaches that keep coming back (recurring).  You feel dizzy.  You have swelling in your ankles.  You have trouble with your vision. Get help right away if:  You develop a severe headache or confusion.  You have unusual weakness or numbness.  You feel faint.  You have severe pain in your chest or abdomen.  You vomit repeatedly.  You have trouble breathing. Summary  Hypertension is when the force of blood pumping through your arteries is too strong. If this condition is not controlled, it may put you at risk for serious complications.  Your personal target blood pressure may vary depending on your medical conditions, your age, and other factors. For most people, a normal blood pressure is less than 120/80.  Hypertension is treated with lifestyle changes, medicines, or a combination of both. Lifestyle changes include weight loss, eating a healthy, low-sodium diet, exercising more, and limiting alcohol. This information is not intended to replace advice given to you by your health care provider. Make sure you discuss any questions you have with your health care provider. Document Released: 03/29/2005 Document  Revised: 02/25/2016 Document Reviewed: 02/25/2016 Elsevier Interactive Patient Education  2018 [employer].    How to Take Your Blood Pressure   Blood pressure is a measurement of how strongly your blood is pressing against the walls of your arteries. Arteries are blood vessels that carry blood from your heart throughout your body. Your health care provider takes your blood pressure at each office visit. You can also take your own blood pressure at home with a blood pressure machine. You may need to take your own blood pressure:  To confirm a diagnosis of high blood pressure (hypertension).  To monitor your blood pressure over time.  To make sure your blood pressure medicine is working.  Supplies needed: To take your blood pressure, you will need a blood pressure machine. You can buy a blood pressure machine, or blood pressure monitor, at most drugstores or online. There are several types of home blood pressure monitors. When choosing one, consider the following:  Choose a monitor that has an arm cuff.  Choose a monitor that wraps snugly around your upper arm. You should be able to fit only one finger between your arm and the cuff.  Do not choose a monitor that measures your blood pressure from your wrist or finger.  Your health care provider can suggest a reliable monitor that will meet your needs. How to prepare To get the most accurate reading, avoid the following for 30 minutes before you check your blood pressure:  Drinking caffeine.  Drinking alcohol.  Eating.  Smoking.  Exercising.  Five minutes before you check your blood pressure:  Empty your bladder.  Sit quietly without talking in a dining chair, rather than in a soft couch or armchair.  How to take your blood pressure To check your blood pressure, follow the instructions in the manual that came with your blood pressure monitor. If you have a digital blood pressure monitor, the instructions may be as  follows: 1. Sit up straight. 2. Place your feet on the floor. Do not cross your ankles or legs. 3. Rest your left arm at the level of your heart on a table or desk or on the arm of a chair. 4. Pull up your shirt sleeve. 5. Wrap the blood pressure cuff around the upper part of your left arm, 1 inch (2.5 cm) above your elbow. It is best to wrap the cuff around bare skin. 6. Fit the cuff snugly around your arm. You should be able to place only one finger between the cuff and your arm. 7. Position the cord inside the groove of your elbow. 8. Press the power button. 9. Sit quietly while the cuff inflates and deflates. 10.  Read the digital reading on the monitor screen and write it down (record it). 11. Wait 2-3 minutes, then repeat the steps, starting at step 1.  What does my blood pressure reading mean? A blood pressure reading consists of a higher number over a lower number. Ideally, your blood pressure should be below 120/80. The first ("top") number is called the systolic pressure. It is a measure of the pressure in your arteries as your heart beats. The second ("bottom") number is called the diastolic pressure. It is a measure of the pressure in your arteries as the heart relaxes. Blood pressure is classified into four stages. The following are the stages for adults who do not have a short-term serious illness or a chronic condition. Systolic pressure and diastolic pressure are measured in a unit called mm Hg. Normal  Systolic pressure: below 123456.  Diastolic pressure: below 80. Elevated  Systolic pressure: [id number].  Diastolic pressure: below 80. Hypertension stage 1  Systolic pressure: 0000000.  Diastolic pressure: [id number]. Hypertension stage 2  Systolic pressure: [id number] or above.  Diastolic pressure: 90 or above. You can have prehypertension or hypertension even if only the systolic or only the diastolic number in your reading is higher than normal. Follow these instructions at  home:  Check your blood pressure as often as recommended by your health care provider.  Take your monitor to the next appointment with your health care provider to make sure: ? That you are using it correctly. ? That it provides accurate readings.  Be sure you understand what your goal blood pressure numbers are.  Tell your health care provider if you are having any side effects from blood pressure medicine. Contact a health care provider if:  Your blood pressure is consistently high. Get help right away if:  Your systolic blood pressure is higher than 180.  Your diastolic blood pressure is higher than 110. This information is not intended to replace advice given to you by your health care provider. Make sure you discuss any questions you have with your health care provider. Document Released: 09/05/2015 Document Revised: 11/18/2015 Document Reviewed: 09/05/2015 Elsevier Interactive Patient Education  2018 [place] for a Low Cholesterol, Low Saturated Fat Diet   Fats - Limit total intake of fats and oils. - Avoid butter, stick margarine, shortening, lard, palm and coconut oils. - Limit mayonnaise, salad dressings, gravies and sauces, unless they are homemade with low-fat ingredients. - Limit chocolate. - Choose low-fat and nonfat products, such as low-fat mayonnaise, low-fat or non-hydrogenated peanut butter, low-fat or fat-free salad dressings and nonfat gravy. - Use vegetable oil, such as canola or olive oil. - Look for margarine that does not contain trans fatty acids. - Use nuts in moderate amounts. - Read ingredient labels carefully to determine both amount and type of fat present in foods. Limit saturated and trans fats! - Avoid high-fat processed and convenience foods.  Meats and Meat Alternatives - Choose fish, chicken, [place] and lean meats. - Use dried beans, peas, lentils and tofu. - Limit egg yolks to three to four per week. - If you eat red  meat, limit to no more than three servings per week and choose loin or round cuts. - Avoid fatty meats, such as bacon, sausage, franks, luncheon meats and ribs. - Avoid all organ meats, including liver.  Dairy - Choose nonfat or low-fat milk, yogurt and cottage cheese. - Most cheeses are high in fat. Choose cheeses made from non-fat milk, such as mozzarella  and ricotta cheese. - Choose light or fat-free cream cheese and sour cream. - Avoid cream and sauces made with cream.  Fruits and Vegetables - Eat a wide variety of fruits and vegetables. - Use lemon juice, vinegar or "mist" olive oil on vegetables. - Avoid adding sauces, fat or oil to vegetables.  Breads, Cereals and Grains - Choose whole-grain breads, cereals, pastas and rice. - Avoid high-fat snack foods, such as granola, cookies, pies, pastries, doughnuts and croissants.  Cooking Tips - Avoid deep fried foods. - Trim visible fat off meats and remove skin from poultry before cooking. - Bake, broil, boil, poach or roast poultry, fish and lean meats. - Drain and discard fat that drains out of meat as you cook it. - Add little or no fat to foods. - Use vegetable oil sprays to grease pans for cooking or baking. - Steam vegetables. - Use herbs or no-oil marinades to flavor foods.      Risk factors for prediabetes and type 2 diabetes  Researchers don't fully understand why some people develop prediabetes and type 2 diabetes and others don't.  It's clear that certain factors increase the risk, however, including:  Weight. The more fatty tissue you have, the more resistant your cells become to insulin.  Inactivity. The less active you are, the greater your risk. Physical activity helps you control your weight, uses up glucose as energy and makes your cells more sensitive to insulin.  Family history. Your risk increases if a parent or sibling has type 2 diabetes.  Race. Although it's unclear why, people of certain races --  including blacks, Hispanics, American Indians and Asian-Americans -- are at higher risk.  Age. Your risk increases as you get older. This may be because you tend to exercise less, lose muscle mass and gain weight as you age. But type 2 diabetes is also increasing dramatically among children, adolescents and younger adults.  Gestational diabetes. If you developed gestational diabetes when you were pregnant, your risk of developing prediabetes and type 2 diabetes later increases. If you gave birth to a baby weighing more than 9 pounds (4 kilograms), you're also at risk of type 2 diabetes.  Polycystic ovary syndrome. For women, having polycystic ovary syndrome -- a common condition characterized by irregular menstrual periods, excess hair growth and obesity -- increases the risk of diabetes.  High blood pressure. Having blood pressure over 140/90 millimeters of mercury (mm Hg) is linked to an increased risk of type 2 diabetes.  Abnormal cholesterol and triglyceride levels. If you have low levels of high-density lipoprotein (HDL), or "good," cholesterol, your risk of type 2 diabetes is higher. Triglycerides are another type of fat carried in the blood. People with high levels of triglycerides have an increased risk of type 2 diabetes. Your doctor can let you know what your cholesterol and triglyceride levels are.  A good guide to good carbs: The glycemic index ---If you have diabetes, or at risk for diabetes, you know all too well that when you eat carbohydrates, your blood sugar goes up. The total amount of carbs you consume at a meal or in a snack mostly determines what your blood sugar will do. But the food itself also plays a role. A serving of white rice has almost the same effect as eating pure table sugar -- a quick, high spike in blood sugar. A serving of lentils has a slower, smaller effect.  ---Picking good sources of carbs can help you control your blood sugar and  your weight. Even if you don't have  diabetes, eating healthier carbohydrate-rich foods can help ward off a host of chronic conditions, from heart disease to various cancers to, well, diabetes.  ---One way to choose foods is with the glycemic index (GI). This tool measures how much a food boosts blood sugar.  The glycemic index rates the effect of a specific amount of a food on blood sugar compared with the same amount of pure glucose. A food with a glycemic index of 28 boosts blood sugar only 28% as much as pure glucose. One with a GI of 95 acts like pure glucose.    High glycemic foods result in a quick spike in insulin and blood sugar (also known as blood glucose).  Low glycemic foods have a slower, smaller effect- these are healthier for you.   Using the glycemic index Using the glycemic index is easy: choose foods in the low GI category instead of those in the high GI category (see below), and go easy on those in between. Low glycemic index (GI of 55 or less): Most fruits and vegetables, beans, minimally processed grains, pasta, low-fat dairy foods, and nuts.  Moderate glycemic index (GI 56 to 69): White and sweet potatoes, corn, white rice, couscous, breakfast cereals such as Cream of Wheat and Mini Wheats.  High glycemic index (GI of 70 or higher): White bread, rice cakes, most crackers, bagels, cakes, doughnuts, croissants, most packaged breakfast cereals. You can see the values for 100 commons foods and get links to more at www.[email or web address].si.  Swaps for lowering glycemic index  Instead of this high-glycemic index food Eat this lower-glycemic index food  White rice Brown rice or converted rice  Instant oatmeal Steel-cut oats  Cornflakes Bran flakes  Baked potato Pasta, bulgur  White bread Whole-grain bread  Corn Peas or leafy greens       Prediabetes Eating Plan  Prediabetes--also called impaired glucose tolerance or impaired fasting glucose--is a condition that causes blood sugar (blood glucose)  levels to be higher than normal. Following a healthy diet can help to keep prediabetes under control. It can also help to lower the risk of type 2 diabetes and heart disease, which are increased in people who have prediabetes. Along with regular exercise, a healthy diet:  Promotes weight loss.  Helps to control blood sugar levels.  Helps to improve the way that the body uses insulin.   WHAT DO I NEED TO KNOW ABOUT THIS EATING PLAN?   Use the glycemic index (GI) to plan your meals. The index tells you how quickly a food will raise your blood sugar. Choose low-GI foods. These foods take a longer time to raise blood sugar.  Pay close attention to the amount of carbohydrates in the food that you eat. Carbohydrates increase blood sugar levels.  Keep track of how many calories you take in. Eating the right amount of calories will help you to achieve a healthy weight. Losing about 7 percent of your starting weight can help to prevent type 2 diabetes.  You may want to follow a Mediterranean diet. This diet includes a lot of vegetables, lean meats or fish, whole grains, fruits, and healthy oils and fats.   WHAT FOODS CAN I EAT?  Grains Whole grains, such as whole-wheat or whole-grain breads, crackers, cereals, and pasta. Unsweetened oatmeal. Bulgur. Barley. Quinoa. Brown rice. Corn or whole-wheat flour tortillas or taco shells. Vegetables Lettuce. Spinach. Peas. Beets. Cauliflower. Cabbage. Broccoli. Carrots. Tomatoes. Squash. Eggplant. Herbs. Peppers. Onions. Cucumbers.  Brussels sprouts. Fruits Berries. Bananas. Apples. Oranges. Grapes. Papaya. Mango. Pomegranate. Kiwi. Grapefruit. Cherries. Meats and Other Protein Sources Seafood. Lean meats, such as chicken and [place] or lean cuts of pork and beef. Tofu. Eggs. Nuts. Beans. Dairy Low-fat or fat-free dairy products, such as yogurt, cottage cheese, and cheese. Beverages Water. Tea. Coffee. Sugar-free or diet soda. Seltzer water. Milk. Milk  alternatives, such as soy or almond milk. Condiments Mustard. Relish. Low-fat, low-sugar ketchup. Low-fat, low-sugar barbecue sauce. Low-fat or fat-free mayonnaise. Sweets and Desserts Sugar-free or low-fat pudding. Sugar-free or low-fat ice cream and other frozen treats. Fats and Oils Avocado. Walnuts. Olive oil. The items listed above may not be a complete list of recommended foods or beverages. Contact your dietitian for more options.    WHAT FOODS ARE NOT RECOMMENDED?  Grains Refined white flour and flour products, such as bread, pasta, snack foods, and cereals. Beverages Sweetened drinks, such as sweet iced tea and soda. Sweets and Desserts Baked goods, such as cake, cupcakes, pastries, cookies, and cheesecake. The items listed above may not be a complete list of foods and beverages to avoid. Contact your dietitian for more information.   This information is not intended to replace advice given to you by your health care provider. Make sure you discuss any questions you have with your health care provider.   Document Released: 08/13/2014 Document Reviewed: 08/13/2014 Elsevier Interactive Patient Education [employer].

## 2019-06-29 NOTE — Progress Notes (Signed)
Impression and Recommendations:    1. Essential hypertension   2. Elevated lipoprotein(a)   3. Situational anxiety   4. Class 1 obesity with serious comorbidity in adult, unspecified BMI, unspecified obesity type     - Last seen 12/12/2018.  - Last labs obtained 12/04/2018.  Of note, this is my first time meeting patient.  Patient is new to me and was previously being cared for at our office by Mina Marble, NP, who no longer works at primary care [employer].  Essential hypertension - Blood pressure currently is stable, at goal. - Patient will continue current treatment regimen.  See med list.  - Counseled patient on pathophysiology of disease and discussed various treatment options, which always includes dietary and lifestyle modification as first line.   - Lifestyle changes such as dash and heart healthy diets and engaging in a regular exercise program discussed extensively with patient.   - Ambulatory blood pressure monitoring encouraged at least 3 times weekly.  Keep log and bring in every office visit.  Reminded patient that if they ever feel poorly in any way, to check their blood pressure and pulse.  - We will continue to monitor.  Elevated lipoprotein(a) - Patient's last FLP obtained 6 months ago: Triglycerides - 124, WNL. HDL = 44, WNL. LDL = 104, elevated.  - Reviewed that patient's LDL is elevated above goal.  - Prudent dietary changes recommended, such as low saturated & trans fat diets for hyperlipidemia and low carb diets for hypertriglyceridemia.  - Advised patient to limit her intake of red meat, pork, and cheese.  - Encouraged patient to follow AHA guidelines for regular exercise and also engage in weight loss if BMI above 25.   - Handout provided to patient today.  - We will continue to monitor and re-check as discussed.   Chronic Knee Pain - Education provided regarding patient's symptoms and history of leg injury. - Engaged in health  counseling and all questions were answered.  - Encouraged patient to ice the area if she experiences acute pain after exercise. - For preventative maintenance, encouraged patient to engage in prudent exercises to strengthen the muscles around her knee.  - If desired, patient knows she may request follow up with an orthopedic specialist at any time.  - Will continue to monitor.  BMI Counseling - Body mass index is 31.15 kg/m, Class 1 Obesity Explained to patient what BMI refers to, and what it means medically.  Told patient to think about it as a "medical risk stratification measurement" and how increasing BMI is associated with increasing risk/ or worsening state of various diseases such as hypertension, hyperlipidemia, diabetes, premature OA, depression etc.  American Heart Association guidelines for healthy diet, basically Mediterranean diet, and exercise guidelines of 30 minutes 5 days per week or more discussed in detail.  - If desired, patient knows she may be referred to [facility], or a dietician/nutritionist.  - If desired, patient may begin using the LoseIt or MyFitnessPal app to track her nutritional intake.  - Health counseling performed.  All questions answered.  Health Counseling & Preventative Maintenance - Advised patient to continue working toward increased low-impact, weight-bearing exercising to improve overall mental, physical, and emotional health.   - Reviewed the "spokes of the wheel" of mood and health management.  Stressed the importance of ongoing prudent habits, including regular exercise, appropriate sleep hygiene, healthful dietary habits, and prayer/meditation to relax.  - Encouraged patient to engage  in daily physical activity as tolerated, especially a formal exercise routine.  Recommended that the patient eventually strive for at least 150 minutes of moderate cardiovascular activity per week according to guidelines established by the [facility].    - Healthy dietary habits encouraged, including low-carb, and high amounts of lean protein in diet.   - Patient should also consume adequate amounts of water.    Meds ordered this encounter  Medications   losartan (COZAAR) 100 MG tablet    Sig: Take 1 tablet (100 mg total) by mouth daily.    Dispense:  90 tablet    Refill:  0    Medications Discontinued During This Encounter  Medication Reason   losartan (COZAAR) 100 MG tablet Reorder     Please see AVS handed out to patient at the end of our visit for further patient instructions/ counseling done pertaining to today's office visit.   Return for CPE and full fasting blood work in August- 1 yr from last CPE OV.     Note:  This note was prepared with assistance of Dragon voice recognition software. Occasional wrong-word or sound-a-like substitutions may have occurred due to the inherent limitations of voice recognition software.   The [place] was signed into law in 2016 which includes the topic of electronic health records.  This provides immediate access to information in MyChart.  This includes consultation notes, operative notes, office notes, lab results and pathology reports.  If you have any questions about what you read please let us know at your next visit or call us at the office.  We are right here with you.   This case required medical decision making of at least moderate complexity.  This document serves as a record of services personally performed by Mellody Dance, DO. It was created on her behalf by Toni Amend, a trained medical scribe. The creation of this record is based on the scribe's personal observations and the provider's statements to them.   The above documentation from Toni Amend, medical scribe, has been reviewed by Marjory Sneddon,  D.O.   --------------------------------------------------------------------------------------------------------------------------------------------------------------------------------------------------------------------------------------------    Subjective:     Phillips Odor, am serving as scribe for Dr.Delvecchio Madole.    HPI: Diamond Trujillo is a 59 y.o. female who presents to [place] at [facility] today for issues as discussed below.  She has mitral valve prolapse and wonders if she needs an EKG every year.  She was assessed for this in the past.  - Lifestyle Notes she works at a standing desk, engages in resistance exercise, and walks the dog many nights per week.  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  Notes this stopped working after a while, when she plateaued.  In the past, notes she once dropped 8 lbs while using a steroid; believes due to inflammation.  Notes while on steroid, her knees felt better, her hip felt better, "and [the prescription] was just for my foot."  - Knee Pain & Historical Leg Injury She only take meloxicam after working out, if her knee is in severe pain.  "I'll take it for two days and then I'm done."  Says she has to be very mindful  when she engages in lateral activities, because this aggravates her knee.  Notes she tore muscles in her leg very badly after a fall a few years ago.  She did not follow-up with physical therapy; used "lots of ice, lots of lidocaine patches."  Says it took a good year to heal the area.  HPI:  Hypertension:  She does not have a monitor at home, but believes her BP is "pretty good."  - Patient reports good compliance with medication and/or lifestyle  modification  - Her denies acute concerns or problems related to treatment plan  - She denies new onset of: chest pain, exercise intolerance, shortness of breath, dizziness, visual changes, headache, lower extremity swelling or claudication.   Last 3 blood pressure readings in our office are as follows: BP Readings from Last 3 Encounters:  06/29/19 128/79  12/29/18 (!) 142/81  12/12/18 131/80   Filed Weights   06/29/19 0950  Weight: 193 lb (87.5 kg)    HPI:  Hyperlipidemia:  59 y.o. female here for cholesterol follow-up.   - She denies new onset of: myalgias, arthralgias, increased fatigue more than normal, chest pains, exercise intolerance, shortness of breath, dizziness, visual changes, headache, lower extremity swelling or claudication.   Most recent cholesterol panel was:  Lab Results  Component Value Date   CHOL 173 12/04/2018   HDL 44 12/04/2018   LDLCALC 104 (H) 12/04/2018   TRIG 124 12/04/2018   CHOLHDL 3.9 12/04/2018   Hepatic Function Latest Ref Rng & Units 12/04/2018 09/23/2017  Total Protein 6.0 - 8.5 g/dL 6.6 -  Albumin 3.8 - 4.9 g/dL 4.8 -  AST 0 - 40 IU/L 19 17  ALT 0 - 32 IU/L 32 35  Alk Phosphatase 39 - 117 IU/L 69 64  Total Bilirubin 0.0 - 1.2 mg/dL 0.8 -        Wt Readings from Last 3 Encounters:  06/29/19 193 lb (87.5 kg)  12/12/18 190 lb 8 oz (86.4 kg)  11/10/18 193 lb (87.5 kg)   BP Readings from Last 3 Encounters:  06/29/19 128/79  12/29/18 (!) 142/81  12/12/18 131/80   Pulse Readings from Last 3 Encounters:  06/29/19 88  12/29/18 68  12/12/18 73   BMI Readings from Last 3 Encounters:  06/29/19 31.15 kg/m  12/12/18 31.70 kg/m  11/10/18 32.12 kg/m     Patient Care Team    Relationship Specialty Notifications Start End  Mina Marble D, NP PCP - General Family Medicine  08/14/18      Patient Active Problem List   Diagnosis Date Noted   Elevated lipoprotein(a) 06/29/2019   Class 1 obesity with serious comorbidity in  adult 06/29/2019   Visit for suture removal 09/05/2018   Healthcare maintenance 08/16/2018   Situational anxiety 08/16/2018   Essential hypertension 08/16/2018   Gastroesophageal reflux disease 08/16/2018    Past Medical history, Surgical history, Family history, Social history, Allergies and Medications have been entered into the medical record, reviewed and changed as needed.    Current Meds  Medication Sig   albuterol (VENTOLIN HFA) 108 (90 Base) MCG/ACT inhaler Inhale 2 puffs into the lungs every 6 (six) hours as needed for wheezing or shortness of breath.   esomeprazole (NEXIUM) 20 MG packet Take 20 mg by mouth daily before breakfast.   losartan (COZAAR) 100 MG tablet Take 1 tablet (100 mg total) by mouth daily.   [DISCONTINUED] losartan (COZAAR) 100 MG tablet Take 1 tablet (100 mg total) by mouth daily. **PATIENT NEEDS APT  FOR FURTHER REFILLS**    Allergies:  Allergies  Allergen Reactions   Gluten Meal Other (See Comments)    Vomiting, anorexia     Review of Systems:  A fourteen system review of systems was performed and found to be positive as per HPI.   Objective:   Blood pressure 128/79, pulse 88, temperature 97.7 F (36.5 C), temperature source Oral, resp. rate 10, height 5' 6"  (1.676 m), weight 193 lb (87.5 kg), last menstrual period 04/12/2016, SpO2 99 %. Body mass index is 31.15 kg/m. General:  Well Developed, well nourished, appropriate for stated age.  Neuro:  Alert and oriented,  extra-ocular muscles intact  HEENT:  Normocephalic, atraumatic, neck supple, no carotid bruits appreciated  Skin:  no gross rash, warm, pink. Cardiac:  RRR, S1 S2 Respiratory:  ECTA B/L and A/P, Not using accessory muscles, speaking in full sentences- unlabored. Vascular:  Ext warm, no cyanosis apprec.; cap RF less 2 sec. Psych:  No HI/SI, judgement and insight good, Euthymic mood. Full Affect.

## 2019-10-10 ENCOUNTER — Other Ambulatory Visit: Payer: Self-pay | Admitting: Physician Assistant

## 2019-12-04 ENCOUNTER — Other Ambulatory Visit: Payer: Self-pay | Admitting: Physician Assistant

## 2019-12-04 DIAGNOSIS — E7841 Elevated Lipoprotein(a): Secondary | ICD-10-CM

## 2019-12-04 DIAGNOSIS — Z0000 Encounter for general adult medical examination without abnormal findings: Secondary | ICD-10-CM

## 2019-12-04 DIAGNOSIS — I10 Essential (primary) hypertension: Secondary | ICD-10-CM

## 2019-12-07 ENCOUNTER — Other Ambulatory Visit: Payer: 59

## 2019-12-07 ENCOUNTER — Other Ambulatory Visit: Payer: Self-pay

## 2019-12-07 DIAGNOSIS — E7841 Elevated Lipoprotein(a): Secondary | ICD-10-CM

## 2019-12-07 DIAGNOSIS — Z0000 Encounter for general adult medical examination without abnormal findings: Secondary | ICD-10-CM

## 2019-12-07 DIAGNOSIS — I10 Essential (primary) hypertension: Secondary | ICD-10-CM

## 2019-12-09 LAB — COMPREHENSIVE METABOLIC PANEL
ALT: 26 IU/L (ref 0–32)
AST: 20 IU/L (ref 0–40)
Albumin/Globulin Ratio: 2.2 (ref 1.2–2.2)
Albumin: 4.3 g/dL (ref 3.8–4.9)
Alkaline Phosphatase: 76 IU/L (ref 48–121)
BUN/Creatinine Ratio: 14 (ref 9–23)
BUN: 11 mg/dL (ref 6–24)
Bilirubin Total: 0.5 mg/dL (ref 0.0–1.2)
CO2: 26 mmol/L (ref 20–29)
Calcium: 9.2 mg/dL (ref 8.7–10.2)
Chloride: 103 mmol/L (ref 96–106)
Creatinine, Ser: 0.8 mg/dL (ref 0.57–1.00)
GFR calc Af Amer: 94 mL/min/{1.73_m2} (ref >59)
GFR calc non Af Amer: 82 mL/min/{1.73_m2} (ref >59)
Globulin, Total: 2 g/dL (ref 1.5–4.5)
Glucose: 101 mg/dL — ABNORMAL HIGH (ref 65–99)
Potassium: 4.5 mmol/L (ref 3.5–5.2)
Sodium: 141 mmol/L (ref 134–144)
Total Protein: 6.3 g/dL (ref 6.0–8.5)

## 2019-12-09 LAB — CBC
Hematocrit: 41.3 % (ref 34.0–46.6)
Hemoglobin: 13.5 g/dL (ref 11.1–15.9)
MCH: 30.4 pg (ref 26.6–33.0)
MCHC: 32.7 g/dL (ref 31.5–35.7)
MCV: 93 fL (ref 79–97)
Platelets: 155 10*3/uL (ref 150–450)
RBC: 4.44 x10E6/uL (ref 3.77–5.28)
RDW: 12.2 % (ref 11.7–15.4)
WBC: 5.2 10*3/uL (ref 3.4–10.8)

## 2019-12-09 LAB — LIPID PANEL
Chol/HDL Ratio: 4 ratio (ref 0.0–4.4)
Cholesterol, Total: 173 mg/dL (ref 100–199)
HDL: 43 mg/dL (ref >39)
LDL Chol Calc (NIH): 112 mg/dL — ABNORMAL HIGH (ref 0–99)
Triglycerides: 99 mg/dL (ref 0–149)
VLDL Cholesterol Cal: 18 mg/dL (ref 5–40)

## 2019-12-09 LAB — SARS-COV-2 ANTIBODY, IGM: SARS-CoV-2 Antibody, IgM: NEGATIVE

## 2019-12-09 LAB — HEMOGLOBIN A1C
Est. average glucose Bld gHb Est-mCnc: 114 mg/dL
Hgb A1c MFr Bld: 5.6 % (ref 4.8–5.6)

## 2019-12-09 LAB — TSH: TSH: 1.87 u[IU]/mL (ref 0.450–4.500)

## 2019-12-14 ENCOUNTER — Encounter: Payer: 59 | Admitting: Physician Assistant

## 2020-01-10 ENCOUNTER — Ambulatory Visit (INDEPENDENT_AMBULATORY_CARE_PROVIDER_SITE_OTHER): Payer: 59 | Admitting: Physician Assistant

## 2020-01-10 ENCOUNTER — Other Ambulatory Visit: Payer: Self-pay

## 2020-01-10 ENCOUNTER — Encounter: Payer: Self-pay | Admitting: Physician Assistant

## 2020-01-10 VITALS — BP 119/77 | HR 71 | Ht 66.0 in | Wt 187.7 lb

## 2020-01-10 DIAGNOSIS — F418 Other specified anxiety disorders: Secondary | ICD-10-CM | POA: Diagnosis not present

## 2020-01-10 DIAGNOSIS — Z6830 Body mass index (BMI) 30.0-30.9, adult: Secondary | ICD-10-CM

## 2020-01-10 DIAGNOSIS — Z0000 Encounter for general adult medical examination without abnormal findings: Secondary | ICD-10-CM | POA: Diagnosis not present

## 2020-01-10 DIAGNOSIS — Z1231 Encounter for screening mammogram for malignant neoplasm of breast: Secondary | ICD-10-CM

## 2020-01-10 DIAGNOSIS — I10 Essential (primary) hypertension: Secondary | ICD-10-CM

## 2020-01-10 DIAGNOSIS — E669 Obesity, unspecified: Secondary | ICD-10-CM

## 2020-01-10 MED ORDER — LOSARTAN POTASSIUM 100 MG PO TABS: 100.0000 mg | ORAL_TABLET | Freq: Every day | ORAL | 1 refills | Status: DC

## 2020-01-10 MED ORDER — ALPRAZOLAM 0.25 MG PO TABS: 0.2500 mg | ORAL_TABLET | ORAL | 0 refills | Status: DC | PRN

## 2020-01-10 NOTE — Progress Notes (Signed)
Female Physical   Impression and Recommendations:    1. Healthcare maintenance   2. Essential hypertension   3. Encounter for screening mammogram for malignant neoplasm of breast   4. Situational anxiety   5. Class 1 obesity with serious comorbidity and body mass index (BMI) of 30.0 to 30.9 in adult, unspecified obesity type      1) Anticipatory Guidance: Discussed skin CA prevention and sunscreen when outside along with skin surveillance; eating a balanced and modest diet; physical activity at least 25 minutes per day or minimum of 150 min/ week moderate to intense activity.  2) Immunizations / Screenings / Labs:   All immunizations are up-to-date per recommendations or will be updated today if pt allows.    - Patient understands with dental and vision screens they will schedule independently.  - Obtained CBC, CMP, HgA1c, Lipid panel, and TSH when fasting. Most labs were essentially wnl or stable from prior. LDL mildly elevated. - Pt declined Tdap, Shingrix, Influenza vaccine, Hep C and HIV screenings.  - UTD on pap smear, colonoscopy. Placed order for mammogram.  3) Weight:  BMI meaning discussed with patient.  Discussed goal to improve diet habits to improve overall feelings of well being and objective health data. Improve nutrient density of diet through increasing intake of fruits and vegetables and decreasing saturated fats, white flour products and refined sugars. -BMI 30.30: Associated with Hypertension, elevated LDL -Continue with dietary changes and physical activity.  4) Healthcare Maintenance: -Continue current medication regimen. Provided requested refills. PDMP reviewed, no aberrancies noted. Sent refill of Xanax. -Stay well hydrated. -Follow up in 6 months for reg OV: HTN, repeat CMP   Meds ordered this encounter  Medications  . losartan (COZAAR) 100 MG tablet    Sig: Take 1 tablet (100 mg total) by mouth daily.    Dispense:  90 tablet    Refill:  1    Order  Specific Question:   Supervising Provider    Answer:   Beatrice Lecher D [[id number]]  . ALPRAZolam (XANAX) 0.25 MG tablet    Sig: Take 1 tablet (0.25 mg total) by mouth as needed for anxiety. Take 1 tablet prior to flying    Dispense:  10 tablet    Refill:  0    Order Specific Question:   Supervising Provider    Answer:   Beatrice Lecher D [[id number]]    Orders Placed This Encounter  Procedures  . MM Digital Screening     Return in about 6 months (around 07/09/2020) for HTN (repeat CMP).    Gross side effects, risk and benefits, and alternatives of medications discussed with patient.  Patient is aware that all medications have potential side effects and we are unable to predict every side effect or drug-drug interaction that may occur.  Expresses verbal understanding and consents to current therapy plan and treatment regimen.  F-up preventative CPE in 1 year- reminded pt again, this is in addition to any chronic care visits.    Please see orders placed and AVS handed out to patient at the end of our visit for further patient instructions/ counseling done pertaining to today's office visit.  Note:  This note was prepared with assistance of Dragon voice recognition software. Occasional wrong-word or sound-a-like substitutions may have occurred due to the inherent limitations of voice recognition software.    Subjective:     CPE HPI: Diamond Trujillo is a 59 y.o. female who presents to [place] at [facility]  today for a yearly health maintenance exam.   Health Maintenance Summary  - Reviewed and updated, unless pt declines services.  Last Cologuard or Colonoscopy:   11/10/18- repeat in 10 years Tobacco History Reviewed:  Y, never a smoker Alcohol and/or drug use:    No concerns; no use Dental Home: New to area and has not established yet Eye exams: Y Dermatology home: Y  Female Health:  PAP Smear - last known results: 11/10/18- normal, Followed by Ob-Gyn STD  concerns:   none Lumps or breast concerns:  none Breast Cancer Family History:  No      There is no immunization history on file for this patient.   Health Maintenance  Topic Date Due  . Hepatitis C Screening  Never done  . COVID-19 Vaccine (1) Never done  . HIV Screening  Never done  . TETANUS/TDAP  Never done  . MAMMOGRAM  11/21/2018  . INFLUENZA VACCINE  Never done  . PAP SMEAR-Modifier  11/09/2021  . COLONOSCOPY  01/15/2027     Wt Readings from Last 3 Encounters:  01/10/20 187 lb 11.2 oz (85.1 kg)  06/29/19 193 lb (87.5 kg)  12/12/18 190 lb 8 oz (86.4 kg)   BP Readings from Last 3 Encounters:  01/10/20 119/77  06/29/19 128/79  12/29/18 (!) 142/81   Pulse Readings from Last 3 Encounters:  01/10/20 71  06/29/19 88  12/29/18 68     Past Medical History:  Diagnosis Date  . Allergy    Phreesia 01/09/2020  . Anxiety   . Cancer ([place])    melanoma  . Celiac disease   . Hiatal hernia   . Hypertension   . MVP (mitral valve prolapse)       Past Surgical History:  Procedure Laterality Date  . CESAREAN SECTION     x 3  . MELANOMA EXCISION    . MENISCUS REPAIR Right   . TRIGGER FINGER RELEASE     Left Thumb       Family History  Problem Relation Age of Onset  . Cancer Mother        liver, lung, brain  . Hypertension Mother   . Alcohol abuse Mother       Social History   Substance and Sexual Activity  Drug Use Never  ,   Social History   Substance and Sexual Activity  Alcohol Use Yes  . Alcohol/week: 5.0 standard drinks  . Types: 5 Cans of beer per week  ,   Social History   Tobacco Use  Smoking Status Never Smoker  Smokeless Tobacco Never Used  ,   Social History   Substance and Sexual Activity  Sexual Activity Yes  . Birth control/protection: Post-menopausal    Current Outpatient Medications on File Prior to Visit  Medication Sig Dispense Refill  . albuterol (VENTOLIN HFA) 108 (90 Base) MCG/ACT inhaler Inhale 2 puffs  into the lungs every 6 (six) hours as needed for wheezing or shortness of breath. 1 Inhaler 1  . esomeprazole (NEXIUM) 20 MG packet Take 20 mg by mouth daily before breakfast.    . meloxicam (MOBIC) 15 MG tablet Take 15 mg by mouth daily as needed for pain.     No current facility-administered medications on file prior to visit.    Allergies: Wheat bran and Gluten meal  Review of Systems: General:   Denies fever, chills, unexplained weight loss.  Optho/Auditory:   Denies visual changes, blurred vision/LOV Respiratory:   Denies SOB, DOE more than  baseline levels.   Cardiovascular:   Denies chest pain, palpitations, new onset peripheral edema  Gastrointestinal:   Denies nausea, vomiting, diarrhea.  Genitourinary: Denies dysuria, freq/ urgency, flank pain or discharge from genitals.  Endocrine:     Denies hot or cold intolerance, polyuria, polydipsia. Musculoskeletal:   Denies unexplained myalgias, joint swelling, unexplained arthralgias, gait problems.  Skin:  Denies rash, suspicious lesions Neurological:     Denies dizziness, unexplained weakness, numbness  Psychiatric/Behavioral:   Denies mood changes, suicidal or homicidal ideations, hallucinations    Objective:    Blood pressure 119/77, pulse 71, height 5\' 6"  (1.676 m), weight 187 lb 11.2 oz (85.1 kg), last menstrual period 04/12/2016, SpO2 97 %. Body mass index is 30.3 kg/m. General Appearance:    Alert, cooperative, no distress, appears stated age  Head:    Normocephalic, without obvious abnormality, atraumatic  Eyes:    PERRL, conjunctiva/corneas clear, EOM's intact, both eyes  Ears:    Normal TM's and external ear canals, both ears  Nose:   Nares normal, septum midline, mucosa normal, no drainage    or sinus tenderness  Throat:   Lips w/o lesion, mucosa moist, and tongue normal; teeth and   gums normal  Neck:   Supple, symmetrical, trachea midline, no adenopathy;    thyroid:  no enlargement/tenderness/nodules; no carotid    bruit or JVD  Back:     Symmetric, no curvature, ROM normal, no CVA tenderness  Lungs:     Clear to auscultation bilaterally, respirations unlabored, no       Wh/ R/ R  Chest Wall:    No tenderness or gross deformity; normal excursion   Heart:    Regular rate and rhythm, S1 and S2 normal, no murmur  Breast Exam:    Deferred to Ob-Gyn.  Abdomen:     Soft, non-tender, bowel sounds active all four quadrants, NO   G/R/R, no masses, no organomegaly  Genitalia:    Deferred to Ob-Gyn.  Rectal:    Deferred to Ob-Gyn.  Extremities:   Extremities normal, atraumatic, no cyanosis or gross edema  Pulses:   Normal  Skin:   Warm, dry, Skin color, texture, turgor normal, no obvious rashes or lesions Psych: No HI/SI, judgement and insight good, Euthymic mood. Full Affect.  Neurologic:   CNII-XII grossly intact, normal strength, sensation and reflexes throughout

## 2020-01-10 NOTE — Patient Instructions (Addendum)
Preventive Care 19-58 Years Old, Female Preventive care refers to visits with your health care provider and lifestyle choices that can promote health and wellness. This includes:  A yearly physical exam. This may also be called an annual well check.  Regular dental visits and eye exams.  Immunizations.  Screening for certain conditions.  Healthy lifestyle choices, such as eating a healthy diet, getting regular exercise, not using drugs or products that contain nicotine and tobacco, and limiting alcohol use. What can I expect for my preventive care visit? Physical exam Your health care provider will check your:  Height and weight. This may be used to calculate body mass index (BMI), which tells if you are at a healthy weight.  Heart rate and blood pressure.  Skin for abnormal spots. Counseling Your health care provider may ask you questions about your:  Alcohol, tobacco, and drug use.  Emotional well-being.  Home and relationship well-being.  Sexual activity.  Eating habits.  Work and work Statistician.  Method of birth control.  Menstrual cycle.  Pregnancy history. What immunizations do I need?  Influenza (flu) vaccine  This is recommended every year. Tetanus, diphtheria, and pertussis (Tdap) vaccine  You may need a Td booster every 10 years. Varicella (chickenpox) vaccine  You may need this if you have not been vaccinated. Zoster (shingles) vaccine  You may need this after age 75. Measles, mumps, and rubella (MMR) vaccine  You may need at least one dose of MMR if you were born in 1957 or later. You may also need a second dose. Pneumococcal conjugate (PCV13) vaccine  You may need this if you have certain conditions and were not previously vaccinated. Pneumococcal polysaccharide (PPSV23) vaccine  You may need one or two doses if you smoke cigarettes or if you have certain conditions. Meningococcal conjugate (MenACWY) vaccine  You may need this if  you have certain conditions. Hepatitis A vaccine  You may need this if you have certain conditions or if you travel or work in places where you may be exposed to hepatitis A. Hepatitis B vaccine  You may need this if you have certain conditions or if you travel or work in places where you may be exposed to hepatitis B. Haemophilus influenzae type b (Hib) vaccine  You may need this if you have certain conditions. Human papillomavirus (HPV) vaccine  If recommended by your health care provider, you may need three doses over 6 months. You may receive vaccines as individual doses or as more than one vaccine together in one shot (combination vaccines). Talk with your health care provider about the risks and benefits of combination vaccines. What tests do I need? Blood tests  Lipid and cholesterol levels. These may be checked every 5 years, or more frequently if you are over 30 years old.  Hepatitis C test.  Hepatitis B test. Screening  Lung cancer screening. You may have this screening every year starting at age 44 if you have a 30-pack-year history of smoking and currently smoke or have quit within the past 15 years.  Colorectal cancer screening. All adults should have this screening starting at age 52 and continuing until age 36. Your health care provider may recommend screening at age 70 if you are at increased risk. You will have tests every 1-10 years, depending on your results and the type of screening test.  Diabetes screening. This is done by checking your blood sugar (glucose) after you have not eaten for a while (fasting). You may  have this done every 1-3 years.  Mammogram. This may be done every 1-2 years. Talk with your health care provider about when you should start having regular mammograms. This may depend on whether you have a family history of breast cancer.  BRCA-related cancer screening. This may be done if you have a family history of breast, ovarian, tubal, or  peritoneal cancers.  Pelvic exam and Pap test. This may be done every 3 years starting at age 26. Starting at age 47, this may be done every 5 years if you have a Pap test in combination with an HPV test. Other tests  Sexually transmitted disease (STD) testing.  Bone density scan. This is done to screen for osteoporosis. You may have this scan if you are at high risk for osteoporosis. Follow these instructions at home: Eating and drinking  Eat a diet that includes fresh fruits and vegetables, whole grains, lean protein, and low-fat dairy.  Take vitamin and mineral supplements as recommended by your health care provider.  Do not drink alcohol if: ? Your health care provider tells you not to drink. ? You are pregnant, may be pregnant, or are planning to become pregnant.  If you drink alcohol: ? Limit how much you have to 0-1 drink a day. ? Be aware of how much alcohol is in your drink. In the [place] one drink equals one 12 oz bottle of beer (355 mL), one 5 oz glass of wine (148 mL), or one 1 oz glass of hard liquor (44 mL). Lifestyle  Take daily care of your teeth and gums.  Stay active. Exercise for at least 30 minutes on 5 or more days each week.  Do not use any products that contain nicotine or tobacco, such as cigarettes, e-cigarettes, and chewing tobacco. If you need help quitting, ask your health care provider.  If you are sexually active, practice safe sex. Use a condom or other form of birth control (contraception) in order to prevent pregnancy and STIs (sexually transmitted infections).  If told by your health care provider, take low-dose aspirin daily starting at age 39. What's next?  Visit your health care provider once a year for a well check visit.  Ask your health care provider how often you should have your eyes and teeth checked.  Stay up to date on all vaccines. This information is not intended to replace advice given to you by your health care provider. Make  sure you discuss any questions you have with your health care provider. Document Revised: 12/08/2017 Document Reviewed: 12/08/2017 Elsevier Patient Education  2020 [employer].

## 2020-01-16 ENCOUNTER — Telehealth: Payer: Self-pay | Admitting: Physician Assistant

## 2020-01-16 NOTE — Telephone Encounter (Signed)
Patient and her husband were in office recently and she forgot to get a paper signed saying they were here for insurance. She would like to bring those papers by and get them signed by maritza at her convince. Thanks

## 2020-01-16 NOTE — Telephone Encounter (Signed)
Please advise patient she can drop forms off to be filled out but that we have 5-7 day turn around for all paperwork. AS, CMA

## 2020-01-25 ENCOUNTER — Ambulatory Visit
Admission: RE | Admit: 2020-01-25 | Discharge: 2020-01-25 | Disposition: A | Discharge status: Home / Self Care | Payer: 59 | Source: Ambulatory Visit | Attending: Physician Assistant | Admitting: Physician Assistant

## 2020-01-25 ENCOUNTER — Other Ambulatory Visit: Payer: Self-pay

## 2020-01-25 DIAGNOSIS — Z1231 Encounter for screening mammogram for malignant neoplasm of breast: Secondary | ICD-10-CM

## 2020-02-01 ENCOUNTER — Telehealth: Payer: Self-pay

## 2020-02-01 NOTE — Telephone Encounter (Signed)
AEX 10/2018 Last MMG 01/25/20- Birads 1, neg  PMP, no HRT Hx. Melanoma  Spoke with pt. Pt states finding vaginal bump on external left labia yesterday. States unknown how long its been there. Size of a "kidney bean". Pt denies any redness, warmth, fever, drainage or pain to site. Pt has not used any OTC treatments. States has external itching on labia that started a week ago. Denies any vaginal discharge, odor, yeast or UTI sx. Pt states is concerned due to melanoma hx.   Pt advised to have OV for further evaluation. Pt agreeable. Pt advised no available appts with Dr Sabra Heck at this time, pt ok to see another provider. Pt scheduled with Dr Quincy Simmonds on 10/26 at 1 pm. Pt verbalized understanding to date and time of appt. Pt advised to monitor site and can apply warm compresses if needed. Pt declines to see PCP earlier.  Routing to Dr Quincy Simmonds for review. Encounter closed

## 2020-02-01 NOTE — Telephone Encounter (Signed)
Patient found a lump in vaginal area.

## 2020-02-04 ENCOUNTER — Telehealth: Payer: Self-pay

## 2020-02-04 NOTE — Telephone Encounter (Signed)
Patient cancelled appointment Tuesday because she is feeling better.

## 2020-02-04 NOTE — Progress Notes (Deleted)
GYNECOLOGY  VISIT   HPI: 59 y.o.   Married  Caucasian  female   [id number] with Patient's last menstrual period was 04/12/2016 (approximate).   here for     GYNECOLOGIC HISTORY: Patient's last menstrual period was 04/12/2016 (approximate). Contraception:  PMP Menopausal hormone therapy: None Last mammogram: 01-29-20 Neg/density B/Birads1 Last pap smear: 11-10-18 Neg, 10/12/17 neg:Neg HR HPV        OB History    Gravida  3   Para  3   Term  3   Preterm      AB      Living        SAB      TAB      Ectopic      Multiple      Live Births                 Patient Active Problem List   Diagnosis Date Noted  . 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 Class 1 obesity with serious comorbidity in adult 06/29/2019  . Visit for suture removal 09/05/2018  . Healthcare maintenance 08/16/2018  . Situational anxiety 08/16/2018  . Essential hypertension 08/16/2018  . Gastroesophageal reflux disease 08/16/2018    Past Medical History:  Diagnosis Date  . Allergy    Phreesia 01/09/2020  . Anxiety   . Cancer ([place])    melanoma  . Celiac disease   . Hiatal hernia   . Hypertension   . MVP (mitral valve prolapse)     Past Surgical History:  Procedure Laterality Date  . CESAREAN SECTION     x 3  . MELANOMA EXCISION    . MENISCUS REPAIR Right   . TRIGGER FINGER RELEASE     Left Thumb     Current Outpatient Medications  Medication Sig Dispense Refill  . albuterol (VENTOLIN HFA) 108 (90 Base) MCG/ACT inhaler Inhale 2 puffs into the lungs every 6 (six) hours as needed for wheezing or shortness of breath. 1 Inhaler 1  . ALPRAZolam (XANAX) 0.25 MG tablet Take 1 tablet (0.25 mg total) by mouth as needed for anxiety. Take 1 tablet prior to flying 10 tablet 0  . esomeprazole (NEXIUM) 20 MG packet Take 20 mg by mouth daily before breakfast.    . losartan (COZAAR) 100 MG tablet Take 1 tablet (100 mg total) by mouth daily. 90 tablet 1  . meloxicam (MOBIC) 15 MG tablet Take 15 mg by  mouth daily as needed for pain.     No current facility-administered medications for this visit.     ALLERGIES: Wheat bran and Gluten meal  Family History  Problem Relation Age of Onset  . Cancer Mother        liver, lung, brain  . Hypertension Mother   . Alcohol abuse Mother     Social History   Socioeconomic History  . Marital status: Married    Spouse name: Not on file  . Number of children: Not on file  . Years of education: Not on file  . Highest education level: Not on file  Occupational History  . Not on file  Tobacco Use  . Smoking status: Never Smoker  . Smokeless tobacco: Never Used  Vaping Use  . Vaping Use: Never used  Substance and Sexual Activity  . Alcohol use: Yes    Alcohol/week: 5.0 standard drinks    Types: 5 Cans of beer per week  . Drug use: Never  . Sexual activity: Yes    Birth control/protection: Post-menopausal  Other Topics Concern  . Not on file  Social History Narrative  . Not on file   Social Determinants of Health   Financial Resource Strain:   . Difficulty of Paying Living Expenses: Not on file  Food Insecurity:   . Worried About Charity fundraiser in the Last Year: Not on file  . Ran Out of Food in the Last Year: Not on file  Transportation Needs:   . Lack of Transportation (Medical): Not on file  . Lack of Transportation (Non-Medical): Not on file  Physical Activity:   . Days of Exercise per Week: Not on file  . Minutes of Exercise per Session: Not on file  Stress:   . Feeling of Stress : Not on file  Social Connections:   . Frequency of Communication with Friends and Family: Not on file  . Frequency of Social Gatherings with Friends and Family: Not on file  . Attends Religious Services: Not on file  . Active Member of Clubs or Organizations: Not on file  . Attends Archivist Meetings: Not on file  . Marital Status: Not on file  Intimate Partner Violence:   . Fear of Current or Ex-Partner: Not on file  .  Emotionally Abused: Not on file  . Physically Abused: Not on file  . Sexually Abused: Not on file    Review of Systems  PHYSICAL EXAMINATION:    LMP 04/12/2016 (Approximate)     General appearance: alert, cooperative and appears stated age Head: Normocephalic, without obvious abnormality, atraumatic Neck: no adenopathy, supple, symmetrical, trachea midline and thyroid normal to inspection and palpation Lungs: clear to auscultation bilaterally Breasts: normal appearance, no masses or tenderness, No nipple retraction or dimpling, No nipple discharge or bleeding, No axillary or supraclavicular adenopathy Heart: regular rate and rhythm Abdomen: soft, non-tender, no masses,  no organomegaly Extremities: extremities normal, atraumatic, no cyanosis or edema Skin: Skin color, texture, turgor normal. No rashes or lesions Lymph nodes: Cervical, supraclavicular, and axillary nodes normal. No abnormal inguinal nodes palpated Neurologic: Grossly normal  Pelvic: External genitalia:  no lesions              Urethra:  normal appearing urethra with no masses, tenderness or lesions              Bartholins and Skenes: normal                 Vagina: normal appearing vagina with normal color and discharge, no lesions              Cervix: no lesions                Bimanual Exam:  Uterus:  normal size, contour, position, consistency, mobility, non-tender              Adnexa: no mass, fullness, tenderness              Rectal exam: {yes no:314532}.  Confirms.              Anus:  normal sphincter tone, no lesions  Chaperone was present for exam.  ASSESSMENT     PLAN     An After Visit Summary was printed and given to the patient.  ______ minutes face to face time of which over 50% was spent in counseling.

## 2020-02-04 NOTE — Telephone Encounter (Signed)
Spoke with pt. Pt states feeling better and does not want to be seen at this time. Pt advised to return call to office with any further concerns. Pt agreeable and verbalized understanding.  Encounter closed.

## 2020-02-05 ENCOUNTER — Ambulatory Visit: Payer: 59 | Admitting: Obstetrics and Gynecology

## 2020-03-26 ENCOUNTER — Telehealth: Payer: Self-pay | Admitting: Physician Assistant

## 2020-03-26 NOTE — Telephone Encounter (Signed)
Patient has tested positive for COVID and would like advice and an antibody infusion details. Thanks

## 2020-03-27 NOTE — Telephone Encounter (Signed)
Left message for patient to call back. AS, CMA 

## 2020-03-28 ENCOUNTER — Other Ambulatory Visit: Payer: Self-pay | Admitting: Physician Assistant

## 2020-03-28 DIAGNOSIS — U071 COVID-19: Secondary | ICD-10-CM

## 2020-03-28 DIAGNOSIS — I10 Essential (primary) hypertension: Secondary | ICD-10-CM

## 2020-03-28 NOTE — Telephone Encounter (Signed)
Patient denies SOB, fever or difficulty breathing. Patient onset of symptoms 03/22/20 and patient tested positive for covid on 03/24/20. Patient requesting antibody infusion. I have called the infusion line and left message for them with patient information to contact to schedule. AS, CMA

## 2020-03-28 NOTE — Progress Notes (Signed)
I connected by phone with Diamond Trujillo on 03/28/2020 at 11:43 AM to discuss the potential use of a new treatment for mild to moderate COVID-19 viral infection in non-hospitalized patients.  This patient is a 59 y.o. female that meets the FDA criteria for Emergency Use Authorization of COVID monoclonal antibody casirivimab/imdevimab, bamlanivimab/etesevimab, or sotrovimab.  Has a (+) direct SARS-CoV-2 viral test result  Has mild or moderate COVID-19   Is NOT hospitalized due to COVID-19  Is within 10 days of symptom onset  Has at least one of the high risk factor(s) for progression to severe COVID-19 and/or hospitalization as defined in EUA.  Specific high risk criteria : BMI > 25, Cardiovascular disease or hypertension and Other high risk medical condition per CDC:  unvaccinated and geographic location   I have spoken and communicated the following to the patient or parent/caregiver regarding COVID monoclonal antibody treatment:  1. FDA has authorized the emergency use for the treatment of mild to moderate COVID-19 in adults and pediatric patients with positive results of direct SARS-CoV-2 viral testing who are 16 years of age and older weighing at least 40 kg, and who are at high risk for progressing to severe COVID-19 and/or hospitalization.  2. The significant known and potential risks and benefits of COVID monoclonal antibody, and the extent to which such potential risks and benefits are unknown.  3. Information on available alternative treatments and the risks and benefits of those alternatives, including clinical trials.  4. Patients treated with COVID monoclonal antibody should continue to self-isolate and use infection control measures (e.g., wear mask, isolate, social distance, avoid sharing personal items, clean and disinfect "high touch" surfaces, and frequent handwashing) according to CDC guidelines.   5. The patient or parent/caregiver has the option to accept or refuse  COVID monoclonal antibody treatment.  After reviewing this information with the patient, the patient has agreed to receive one of the available covid 19 monoclonal antibodies and will be provided an appropriate fact sheet prior to infusion.   [place], [place] 03/28/2020 11:43 AM

## 2020-03-29 ENCOUNTER — Ambulatory Visit (HOSPITAL_COMMUNITY)
Admission: RE | Admit: 2020-03-29 | Discharge: 2020-03-29 | Disposition: A | Discharge status: Home / Self Care | Payer: 59 | Source: Ambulatory Visit | Attending: Pulmonary Disease | Admitting: Pulmonary Disease

## 2020-03-29 DIAGNOSIS — U071 COVID-19: Secondary | ICD-10-CM | POA: Diagnosis present

## 2020-03-29 MED ORDER — FAMOTIDINE IN NACL 20-0.9 MG/50ML-% IV SOLN: 20.0000 mg | Freq: Once | INTRAVENOUS | Status: DC | PRN

## 2020-03-29 MED ORDER — SODIUM CHLORIDE 0.9 % IV SOLN: Freq: Once | INTRAVENOUS | Status: AC

## 2020-03-29 MED ORDER — METHYLPREDNISOLONE SODIUM SUCC 125 MG IJ SOLR: 125.0000 mg | Freq: Once | INTRAMUSCULAR | Status: DC | PRN

## 2020-03-29 MED ORDER — DIPHENHYDRAMINE HCL 50 MG/ML IJ SOLN: 50.0000 mg | Freq: Once | INTRAMUSCULAR | Status: DC | PRN

## 2020-03-29 MED ORDER — EPINEPHRINE 0.3 MG/0.3ML IJ SOAJ: 0.3000 mg | Freq: Once | INTRAMUSCULAR | Status: DC | PRN

## 2020-03-29 MED ORDER — ALBUTEROL SULFATE HFA 108 (90 BASE) MCG/ACT IN AERS: 2.0000 | INHALATION_SPRAY | Freq: Once | RESPIRATORY_TRACT | Status: DC | PRN

## 2020-03-29 MED ORDER — SODIUM CHLORIDE 0.9 % IV SOLN: INTRAVENOUS | Status: DC | PRN

## 2020-03-29 NOTE — Progress Notes (Signed)
Patient reviewed Fact Sheet for Patients, Parents, and Caregivers for Emergency Use Authorization (EUA) of bamlanivimab and etesevimab for the Treatment of Coronavirus. Patient also reviewed and is agreeable to the estimated cost of treatment. Patient is agreeable to proceed.   

## 2020-03-29 NOTE — Progress Notes (Signed)
  Diagnosis: COVID-19  Physician: Dr. Asencion Noble  Procedure: [facility] Med: bamlanivimab\etesevimab infusion - Provided patient with bamlanimivab\etesevimab fact sheet for patients, parents and caregivers prior to infusion.  Complications: No immediate complications noted.  Discharge: Discharged home   Diamond Trujillo 03/29/2020

## 2020-03-29 NOTE — Discharge Instructions (Signed)
10 Things You Can Do to Manage Your COVID-19 Symptoms at Home If you have possible or confirmed COVID-19: 1. Stay home from work and school. And stay away from other public places. If you must go out, avoid using any kind of public transportation, ridesharing, or taxis. 2. Monitor your symptoms carefully. If your symptoms get worse, call your healthcare provider immediately. 3. Get rest and stay hydrated. 4. If you have a medical appointment, call the healthcare provider ahead of time and tell them that you have or may have COVID-19. 5. For medical emergencies, call 911 and notify the dispatch personnel that you have or may have COVID-19. 6. Cover your cough and sneezes with a tissue or use the inside of your elbow. 7. Wash your hands often with soap and water for at least 20 seconds or clean your hands with an alcohol-based hand sanitizer that contains at least 60% alcohol. 8. As much as possible, stay in a specific room and away from other people in your home. Also, you should use a separate bathroom, if available. If you need to be around other people in or outside of the home, wear a mask. 9. Avoid sharing personal items with other people in your household, like dishes, towels, and bedding. 10. Clean all surfaces that are touched often, like counters, tabletops, and doorknobs. Use household cleaning sprays or wipes according to the label instructions. cdc.gov/coronavirus 10/11/2018 This information is not intended to replace advice given to you by your health care provider. Make sure you discuss any questions you have with your health care provider. Document Revised: 03/15/2019 Document Reviewed: 03/15/2019 Elsevier Patient Education  2020 [employer] What types of side effects do monoclonal antibody drugs cause?  Common side effects  In general, the more common side effects caused by monoclonal antibody drugs include: . Allergic reactions, such as hives or itching . Flu-like signs and  symptoms, including chills, fatigue, fever, and muscle aches and pains . Nausea, vomiting . Diarrhea . Skin rashes . Low blood pressure   The CDC is recommending patients who receive monoclonal antibody treatments wait at least 90 days before being vaccinated.  Currently, there are no data on the safety and efficacy of mRNA COVID-19 vaccines in persons who received monoclonal antibodies or convalescent plasma as part of COVID-19 treatment. Based on the estimated half-life of such therapies as well as evidence suggesting that reinfection is uncommon in the 90 days after initial infection, vaccination should be deferred for at least 90 days, as a precautionary measure until additional information becomes available, to avoid interference of the antibody treatment with vaccine-induced immune responses. If you have any questions or concerns after the infusion please call the Advanced Practice Provider on call at [phone]. This number is ONLY intended for your use regarding questions or concerns about the infusion post-treatment side-effects.  Please do not provide this number to others for use. For return to work notes please contact your primary care provider.   If someone you know is interested in receiving treatment please have them call the COVID hotline at [phone].   

## 2020-04-08 ENCOUNTER — Ambulatory Visit: Payer: 59 | Admitting: Obstetrics and Gynecology

## 2020-04-08 ENCOUNTER — Telehealth: Payer: Self-pay | Admitting: Physician Assistant

## 2020-04-08 DIAGNOSIS — I10 Essential (primary) hypertension: Secondary | ICD-10-CM

## 2020-04-08 MED ORDER — LOSARTAN POTASSIUM 100 MG PO TABS
100.0000 mg | ORAL_TABLET | Freq: Every day | ORAL | 0 refills | Status: DC
Start: 2020-04-08 — End: 2020-07-09

## 2020-04-08 NOTE — Telephone Encounter (Signed)
Med refill sent to pharmacy. AS, CMA 

## 2020-04-15 ENCOUNTER — Ambulatory Visit (INDEPENDENT_AMBULATORY_CARE_PROVIDER_SITE_OTHER): Payer: 59 | Admitting: Physician Assistant

## 2020-04-15 ENCOUNTER — Telehealth: Payer: Self-pay | Admitting: Physician Assistant

## 2020-04-15 ENCOUNTER — Other Ambulatory Visit: Payer: Self-pay

## 2020-04-15 ENCOUNTER — Encounter: Payer: Self-pay | Admitting: Physician Assistant

## 2020-04-15 VITALS — BP 139/82 | HR 87 | Ht 66.0 in | Wt 192.0 lb

## 2020-04-15 DIAGNOSIS — K209 Esophagitis, unspecified without bleeding: Secondary | ICD-10-CM | POA: Diagnosis not present

## 2020-04-15 DIAGNOSIS — R059 Cough, unspecified: Secondary | ICD-10-CM

## 2020-04-15 MED ORDER — FLUTICASONE-SALMETEROL 100-50 MCG/DOSE IN AEPB: 1.0000 | INHALATION_SPRAY | Freq: Two times a day (BID) | RESPIRATORY_TRACT | 0 refills | Status: DC

## 2020-04-15 MED ORDER — BENZONATATE 100 MG PO CAPS: 100.0000 mg | ORAL_CAPSULE | Freq: Two times a day (BID) | ORAL | 0 refills | Status: DC | PRN

## 2020-04-15 NOTE — Telephone Encounter (Signed)
Patient is coughing and esophagus and is asking for Advair to treat it and the irritation. [facility], thanks.

## 2020-04-15 NOTE — Progress Notes (Signed)
Acute Office Visit  Subjective:    Patient ID: Diamond Trujillo, female    DOB: [birth year], 60 y.o.   MRN: [id number]  No chief complaint on file.   HPI Patient is in today for inflammation of the esophagus related to post-covid cough. Patient was diagnosed with Covid 4 weeks ago. Received monoclonal infusion treatment. Reports overall is feeling better with the exception of cough. Has been taking OTC Delsym with minimal relief. Reports esophagus feels irritated and loses her breath when talking. No history of asthma. Has noticed vitamins getting stuck. States in the past has esophagitis when sick and has been treated with Advair, which tolerated well and helped. Does report intermittent wheezing and some shortness of breath with exertion.  Past Medical History:  Diagnosis Date  . Allergy    Phreesia 01/09/2020  . Anxiety   . Cancer (HCC)    melanoma  . Celiac disease   . Hiatal hernia   . Hypertension   . MVP (mitral valve prolapse)     Past Surgical History:  Procedure Laterality Date  . CESAREAN SECTION     x 3  . MELANOMA EXCISION    . MENISCUS REPAIR Right   . TRIGGER FINGER RELEASE     Left Thumb     Family History  Problem Relation Age of Onset  . Cancer Mother        liver, lung, brain  . Hypertension Mother   . Alcohol abuse Mother     Social History   Socioeconomic History  . Marital status: Married    Spouse name: Not on file  . Number of children: Not on file  . Years of education: Not on file  . Highest education level: Not on file  Occupational History  . Not on file  Tobacco Use  . Smoking status: Never Smoker  . Smokeless tobacco: Never Used  Vaping Use  . Vaping Use: Never used  Substance and Sexual Activity  . Alcohol use: Yes    Alcohol/week: 5.0 standard drinks    Types: 5 Cans of beer per week  . Drug use: Never  . Sexual activity: Yes    Birth control/protection: Post-menopausal  Other Topics Concern  . Not on file  Social  History Narrative  . Not on file   Social Determinants of Health   Financial Resource Strain: Not on file  Food Insecurity: Not on file  Transportation Needs: Not on file  Physical Activity: Not on file  Stress: Not on file  Social Connections: Not on file  Intimate Partner Violence: Not on file    Outpatient Medications Prior to Visit  Medication Sig Dispense Refill  . albuterol (VENTOLIN HFA) 108 (90 Base) MCG/ACT inhaler Inhale 2 puffs into the lungs every 6 (six) hours as needed for wheezing or shortness of breath. 1 Inhaler 1  . ALPRAZolam (XANAX) 0.25 MG tablet Take 1 tablet (0.25 mg total) by mouth as needed for anxiety. Take 1 tablet prior to flying 10 tablet 0  . esomeprazole (NEXIUM) 20 MG packet Take 20 mg by mouth daily before breakfast.    . losartan (COZAAR) 100 MG tablet Take 1 tablet (100 mg total) by mouth daily. 90 tablet 0  . meloxicam (MOBIC) 15 MG tablet Take 15 mg by mouth daily as needed for pain.     No facility-administered medications prior to visit.    Allergies  Allergen Reactions  . Wheat Bran   . Gluten Meal Other (See Comments)  Vomiting, anorexia    Review of Systems A fourteen system review of systems was performed and found to be positive as per HPI.    Objective:    Physical Exam General:  Well Developed, well nourished, in no acute distress  Neuro:  Alert and oriented,  extra-ocular muscles intact  HEENT:  Normocephalic, atraumatic, neck supple Skin:  no gross rash, warm, pink. Cardiac:  RRR, S1 S2 Respiratory:  ECTA B/L without wheezing, crackles or rales, Not using accessory muscles, speaking in full sentences- unlabored. Vascular:  Ext warm, no cyanosis apprec.;no gross edema  Psych:  No HI/SI, judgement and insight good, Euthymic mood. Full Affect.   LMP 04/12/2016 (Approximate)  Wt Readings from Last 3 Encounters:  01/10/20 187 lb 11.2 oz (85.1 kg)  06/29/19 193 lb (87.5 kg)  12/12/18 190 lb 8 oz (86.4 kg)    Health  Maintenance Due  Topic Date Due  . Hepatitis C Screening  Never done  . COVID-19 Vaccine (1) Never done  . HIV Screening  Never done  . TETANUS/TDAP  Never done  . INFLUENZA VACCINE  Never done    There are no preventive care reminders to display for this patient.   Lab Results  Component Value Date   TSH 1.870 12/07/2019   Lab Results  Component Value Date   WBC 5.2 12/07/2019   HGB 13.5 12/07/2019   HCT 41.3 12/07/2019   MCV 93 12/07/2019   PLT 155 12/07/2019   Lab Results  Component Value Date   NA 141 12/07/2019   K 4.5 12/07/2019   CO2 26 12/07/2019   GLUCOSE 101 (H) 12/07/2019   BUN 11 12/07/2019   CREATININE 0.80 12/07/2019   BILITOT 0.5 12/07/2019   ALKPHOS 76 12/07/2019   AST 20 12/07/2019   ALT 26 12/07/2019   PROT 6.3 12/07/2019   ALBUMIN 4.3 12/07/2019   CALCIUM 9.2 12/07/2019   Lab Results  Component Value Date   CHOL 173 12/07/2019   Lab Results  Component Value Date   HDL 43 12/07/2019   Lab Results  Component Value Date   LDLCALC 112 (H) 12/07/2019   Lab Results  Component Value Date   TRIG 99 12/07/2019   Lab Results  Component Value Date   CHOLHDL 4.0 12/07/2019   Lab Results  Component Value Date   HGBA1C 5.6 12/07/2019       Assessment & Plan:   Problem List Items Addressed This Visit   None   Visit Diagnoses    Esophagitis    -  Primary   Relevant Medications   Fluticasone-Salmeterol (ADVAIR DISKUS) 100-50 MCG/DOSE AEPB   Cough       Relevant Medications   benzonatate (TESSALON) 100 MG capsule     Esophagitis, Cough:  -No adventitious sounds heard on exam, however, deep breathing provoked cough. Will send rx for Advair and tessalon perles.   -Recommend to continue with home supportive therapy.  -Follow up if symptoms fail to improve or worsen. Discussed with patient trial low dose/short course of prednisone if cough fails to improve or worsen.    Meds ordered this encounter  Medications  .  Fluticasone-Salmeterol (ADVAIR DISKUS) 100-50 MCG/DOSE AEPB    Sig: Inhale 1 puff into the lungs in the morning and at bedtime.    Dispense:  60 each    Refill:  0    Order Specific Question:   Supervising Provider    Answer:   Nani Gasser D [[id number]]  . benzonatate (  TESSALON) 100 MG capsule    Sig: Take 1 capsule (100 mg total) by mouth 2 (two) times daily as needed for cough.    Dispense:  20 capsule    Refill:  0    Order Specific Question:   Supervising Provider    Answer:   Beatrice Lecher D [[id number]]     Lorrene Reid, PA-C

## 2020-04-15 NOTE — Telephone Encounter (Signed)
Patient states that her esophagus is inflamed and that in the past her providers have given her Adavair #30 day supply since this helps with her esophagus healing. AS< CMA

## 2020-04-15 NOTE — Patient Instructions (Signed)
Esophagitis  Esophagitis is inflammation of the esophagus. The esophagus is the tube that carries food from your mouth to your stomach. Esophagitis can cause soreness or pain in the esophagus. This condition can make it difficult and painful to swallow. What are the causes? Most causes of esophagitis are not serious. Common causes of this condition include:  Gastroesophageal reflux disease (GERD). This is when stomach contents move back up into the esophagus (reflux).  Repeated vomiting.  An allergic reaction, especially caused by food allergies (eosinophilic esophagitis).  Injury to the esophagus by swallowing large pills with or without water, or swallowing certain types of medicines.  Swallowing (ingesting) harmful chemicals, such as household cleaning products.  Heavy alcohol use.  An infection of the esophagus. This most often occurs in people who have a weakened immune system.  Radiation or chemotherapy treatment for cancer.  Certain diseases such as sarcoidosis, Crohn's disease, and scleroderma. What are the signs or symptoms? Symptoms of this condition include:  Difficult or painful swallowing.  Pain with swallowing acidic liquids, such as citrus juices.  Pain with burping.  Chest pain.  Difficulty breathing.  Nausea.  Vomiting.  Pain in the abdomen.  Weight loss.  Ulcers in the mouth.  Patches of white material in the mouth (candidiasis).  Fever.  Coughing up blood or vomiting blood.  Stool that is black, tarry, or bright red. How is this diagnosed? Your health care provider will take a medical history and perform a physical exam. You may also have other tests, including:  An endoscopy to examine your esophagus and stomach with a small flexible tube with a camera.  A test that measures the acidity level in your esophagus.  A test that measures how much pressure is on your esophagus.  A barium swallow or modified barium swallow to show the shape,  size, and functioning of your esophagus.  Allergy tests. How is this treated? Treatment for this condition depends on the cause of your esophagitis. In some cases, steroids or other medicines may be given to help relieve your symptoms or to treat the underlying cause of your condition. You may have to make some lifestyle changes, such as:  Avoiding alcohol.  Quitting smoking.  Changing your diet.  Exercising.  Changing your sleep habits and your sleep environment. Follow these instructions at home: Medicines  Take over-the-counter and prescription medicines only as told by your health care provider.  Do not take aspirin, ibuprofen, or other NSAIDs unless your health care provider told you to do so.  If you have trouble taking pills: ? Use a pill splitter to decrease the size of the pill. This will decrease the chance of the pill getting stuck or injuring your esophagus. ? Drink water after you take a pill. Eating and drinking   Avoid foods and drinks that seem to make your symptoms worse.  Follow a diet as recommended by your health care provider. This may involve avoiding foods and drinks such as: ? Coffee and tea (with or without caffeine). ? Drinks that contain alcohol. ? Energy drinks and sports drinks. ? Carbonated drinks or sodas. ? Chocolate and cocoa. ? Peppermint and mint flavorings. ? Garlic and onions. ? Horseradish. ? Spicy and acidic foods, including peppers, chili powder, curry powder, vinegar, hot sauces, and barbecue sauce. ? Citrus fruit juices and citrus fruits, such as oranges, lemons, and limes. ? Tomato-based foods, such as red sauce, chili, salsa, and pizza with red sauce. ? Fried and fatty foods, such as   donuts, french fries, potato chips, and high-fat dressings. ? High-fat meats, such as hot dogs and fatty cuts of red and white meats, such as rib eye steak, sausage, ham, and bacon. ? High-fat dairy items, such as whole milk, butter, and cream  cheese. Lifestyle  Eat small, frequent meals instead of large meals.  Avoid drinking large amounts of liquid with your meals.  Avoid eating meals during the 2-3 hours before bedtime.  Avoid lying down right after you eat.  Do not exercise right after you eat.  Do not use any products that contain nicotine or tobacco, such as cigarettes and e-cigarettes. If you need help quitting, ask your health care provider. General instructions   Pay attention to any changes in your symptoms. Let your health care provider know about them.  Wear loose-fitting clothing. Do not wear anything tight around your waist that causes pressure on your abdomen.  Raise (elevate) the head of your bed about 6 inches (15 cm).  Try relaxation strategies such as yoga, deep breathing, or meditation to manage stress. If you need help reducing stress, ask your health care provider.  If you are overweight, reduce your weight to an amount that is healthy for you. Ask your health care provider for guidance about a safe weight loss goal.  Keep all follow-up visits as told by your health care provider. This is important. Contact a health care provider if:  You have new symptoms.  You have unexplained weight loss.  You have difficulty swallowing, or it hurts to swallow.  You have wheezing or a cough that does not go away.  Your symptoms do not improve with treatment.  You have frequent heartburn for more than two weeks. Get help right away if:  You have severe pain in your arms, neck, jaw, teeth, or back.  You feel sweaty, dizzy, or light-headed.  You have chest pain or shortness of breath.  You vomit and your vomit looks like blood or coffee grounds.  Your stool is bloody or black.  You have a fever.  You cannot swallow, drink, or eat. Summary  Esophagitis is inflammation of the esophagus.  Most causes of esophagitis are not serious.  Follow your health care provider's instructions about eating  and drinking. Follow instructions on medicines.  Contact a health care provider if you have new symptoms, have weight loss, or coughing that does not stop.  Get help right away if you have severe pain in the arms, neck, jaw, teeth or back, or if you have chest pain, shortness of breath, or fever. This information is not intended to replace advice given to you by your health care provider. Make sure you discuss any questions you have with your health care provider. Document Revised: 11/18/2017 Document Reviewed: 11/18/2017 Elsevier Patient Education  2020 [employer]  

## 2020-04-15 NOTE — Telephone Encounter (Signed)
Patient aware and had OV today. AS, CMA

## 2020-04-15 NOTE — Telephone Encounter (Signed)
Per Kandis Cocking patient will need an apt before we can send any medication to pharmacy. AS, CMA

## 2020-05-06 NOTE — Progress Notes (Signed)
60 y.o. [id number] Married Caucasian female here for annual exam.    No problems with menopause.  Hot flashes gone.  She is struggling with weight gain.   Has a vulvar cyst.   Thinks she has hemorrhoid swelling but not bleeding.  Constipation issues.   Sees dermatology regularly.   Living in [place] for 2 years.   Family had Covid in December.  Took monoclonal aby treatment.   PCP: Lorrene Reid, PA-C    Patient's last menstrual period was 04/12/2016 (approximate).           Sexually active: Yes.    The current method of family planning is post menopausal status.    Exercising: Yes.    5 times per week Smoker:  no  Health Maintenance: Pap: 11-10-18 Neg:Neg HR HPV, 10/12/17 neg History of abnormal Pap:  no MMG:  01-25-20 Neg/BiRads1 Colonoscopy:  01-14-17 normal;next 10 years BMD:   n/a  Result  n/a TDaP: not UTD Gardasil:   no SPQ:ZRAQT Hep C:never Screening Labs:  PCP   reports that she has never smoked. She has never used smokeless tobacco. She reports current alcohol use of about 5.0 standard drinks of alcohol per week. She reports that she does not use drugs.  Past Medical History:  Diagnosis Date   Allergy    Phreesia 01/09/2020   Anxiety    Cancer ([place])    melanoma   Celiac disease    Hiatal hernia    Hypertension    MVP (mitral valve prolapse)     Past Surgical History:  Procedure Laterality Date   CESAREAN SECTION     x 3   MELANOMA EXCISION     MENISCUS REPAIR Right    TRIGGER FINGER RELEASE     Left Thumb     Current Outpatient Medications  Medication Sig Dispense Refill   albuterol (VENTOLIN HFA) 108 (90 Base) MCG/ACT inhaler Inhale 2 puffs into the lungs every 6 (six) hours as needed for wheezing or shortness of breath. 1 Inhaler 1   ALPRAZolam (XANAX) 0.25 MG tablet Take 1 tablet (0.25 mg total) by mouth as needed for anxiety. Take 1 tablet prior to flying 10 tablet 0   esomeprazole (NEXIUM) 20 MG packet Take 20 mg by mouth daily  before breakfast.     Fluticasone-Salmeterol (ADVAIR DISKUS) 100-50 MCG/DOSE AEPB Inhale 1 puff into the lungs in the morning and at bedtime. 60 each 0   losartan (COZAAR) 100 MG tablet Take 1 tablet (100 mg total) by mouth daily. 90 tablet 0   meloxicam (MOBIC) 15 MG tablet Take 15 mg by mouth daily as needed for pain.     No current facility-administered medications for this visit.    Family History  Problem Relation Age of Onset   Cancer Mother        liver, lung, brain   Hypertension Mother    Alcohol abuse Mother     Review of Systems  Constitutional: Negative.   HENT: Negative.   Eyes: Negative.   Respiratory: Negative.   Cardiovascular: Negative.   Gastrointestinal: Negative.   Endocrine: Negative.   Genitourinary: Negative.   Musculoskeletal: Negative.   Skin: Negative.   Allergic/Immunologic: Negative.   Neurological: Negative.   Hematological: Negative.   Psychiatric/Behavioral: Negative.     Exam:   BP 122/70 (BP Location: Left Arm, Patient Position: Sitting, Cuff Size: Large)    Pulse 72    Ht 5\' 5"  (1.651 m)    Wt 191 lb (86.6 kg)  LMP 04/12/2016 (Approximate)    BMI 31.78 kg/m     General appearance: alert, cooperative and appears stated age Head: normocephalic, without obvious abnormality, atraumatic Neck: no adenopathy, supple, symmetrical, trachea midline and thyroid normal to inspection and palpation Lungs: clear to auscultation bilaterally Breasts: normal appearance, no masses or tenderness, No nipple retraction or dimpling, No nipple discharge or bleeding, No axillary adenopathy Heart: regular rate and rhythm Abdomen: soft, non-tender; no masses, no organomegaly Extremities: extremities normal, atraumatic, no cyanosis or edema Skin: skin color, texture, turgor normal. No rashes or lesions Lymph nodes: cervical, supraclavicular, and axillary nodes normal. Neurologic: grossly normal  Pelvic: External genitalia:  no lesions              No  abnormal inguinal nodes palpated.              Urethra:  normal appearing urethra with no masses, tenderness or lesions              Bartholins and Skenes: normal                 Vagina: normal appearing vagina with normal color and discharge, no lesions              Cervix: no lesions              Pap taken: No. Bimanual Exam:  Uterus:  normal size, contour, position, consistency, mobility, non-tender              Adnexa: no mass, fullness, tenderness              Rectal exam: Yes.  .  Confirms.              Anus:  normal sphincter tone, external hemorrhoids.  Chaperone was present for exam.  Assessment:   Well woman visit with normal exam. Hx melanoma.   Plan: Mammogram screening discussed. Self breast awareness reviewed. Pap and HR HPV 2025. Guidelines for Calcium, Vitamin D, regular exercise program including cardiovascular and weight bearing exercise. We discussed vaccines today.  Labs with PCP.  Follow up annually and prn.

## 2020-05-07 ENCOUNTER — Other Ambulatory Visit: Payer: Self-pay

## 2020-05-07 ENCOUNTER — Encounter: Payer: Self-pay | Admitting: Obstetrics and Gynecology

## 2020-05-07 ENCOUNTER — Ambulatory Visit: Payer: 59 | Admitting: Obstetrics and Gynecology

## 2020-05-07 VITALS — BP 122/70 | HR 72 | Ht 65.0 in | Wt 191.0 lb

## 2020-05-07 DIAGNOSIS — Z01419 Encounter for gynecological examination (general) (routine) without abnormal findings: Secondary | ICD-10-CM | POA: Diagnosis not present

## 2020-05-07 NOTE — Patient Instructions (Signed)

## 2020-07-09 ENCOUNTER — Ambulatory Visit (INDEPENDENT_AMBULATORY_CARE_PROVIDER_SITE_OTHER): Payer: 59 | Admitting: Physician Assistant

## 2020-07-09 ENCOUNTER — Other Ambulatory Visit: Payer: Self-pay

## 2020-07-09 ENCOUNTER — Encounter: Payer: Self-pay | Admitting: Physician Assistant

## 2020-07-09 VITALS — BP 130/81 | HR 84 | Temp 97.4°F | Ht 66.0 in | Wt 203.7 lb

## 2020-07-09 DIAGNOSIS — I10 Essential (primary) hypertension: Secondary | ICD-10-CM | POA: Diagnosis not present

## 2020-07-09 DIAGNOSIS — Z8679 Personal history of other diseases of the circulatory system: Secondary | ICD-10-CM | POA: Diagnosis not present

## 2020-07-09 DIAGNOSIS — F418 Other specified anxiety disorders: Secondary | ICD-10-CM | POA: Diagnosis not present

## 2020-07-09 MED ORDER — LOSARTAN POTASSIUM 100 MG PO TABS: 100.0000 mg | ORAL_TABLET | Freq: Every day | ORAL | 1 refills | Status: DC

## 2020-07-09 NOTE — Assessment & Plan Note (Signed)
-  Controlled. -Continue current medication regimen. Provided refill. Last CMP normal. -Will continue to monitor.

## 2020-07-09 NOTE — Progress Notes (Signed)
Established Patient Office Visit  Subjective:  Patient ID: Diamond Trujillo, female    DOB: Sep 04, 1960  Age: 60 y.o. MRN: [id number]  CC:  Chief Complaint  Patient presents with  . Follow-up  . Hypertension    HPI Diamond Trujillo presents for follow up on hypertension. Reports has no acute concerns. Has not been checking BP at home. Denies chest pain, palpitations, shortness of breath, dizziness or lower extremity edema. Reports good hydration. Reports remote history of mitral valve prolapse and previous PCP in [place] used to do an EKG annually and has not had one recently.   Situational anxiety: States only takes alprazolam when flying.   Past Medical History:  Diagnosis Date  . Allergy    Phreesia 01/09/2020  . Anxiety   . Cancer ([place])    melanoma  . Celiac disease   . Hiatal hernia   . Hypertension   . MVP (mitral valve prolapse)     Past Surgical History:  Procedure Laterality Date  . CESAREAN SECTION     x 3  . MELANOMA EXCISION    . MENISCUS REPAIR Right   . TRIGGER FINGER RELEASE     Left Thumb     Family History  Problem Relation Age of Onset  . Cancer Mother        liver, lung, brain  . Hypertension Mother   . Alcohol abuse Mother     Social History   Socioeconomic History  . Marital status: Married    Spouse name: Not on file  . Number of children: Not on file  . Years of education: Not on file  . Highest education level: Not on file  Occupational History  . Not on file  Tobacco Use  . Smoking status: Never Smoker  . Smokeless tobacco: Never Used  Vaping Use  . Vaping Use: Never used  Substance and Sexual Activity  . Alcohol use: Yes    Alcohol/week: 5.0 standard drinks    Types: 5 Standard drinks or equivalent per week  . Drug use: Never  . Sexual activity: Yes    Birth control/protection: Post-menopausal  Other Topics Concern  . Not on file  Social History Narrative  . Not on file   Social Determinants of Health   Financial Resource  Strain: Not on file  Food Insecurity: Not on file  Transportation Needs: Not on file  Physical Activity: Not on file  Stress: Not on file  Social Connections: Not on file  Intimate Partner Violence: Not on file    Outpatient Medications Prior to Visit  Medication Sig Dispense Refill  . albuterol (VENTOLIN HFA) 108 (90 Base) MCG/ACT inhaler Inhale 2 puffs into the lungs every 6 (six) hours as needed for wheezing or shortness of breath. 1 Inhaler 1  . ALPRAZolam (XANAX) 0.25 MG tablet Take 1 tablet (0.25 mg total) by mouth as needed for anxiety. Take 1 tablet prior to flying 10 tablet 0  . esomeprazole (NEXIUM) 20 MG packet Take 20 mg by mouth daily before breakfast.    . Fluticasone-Salmeterol (ADVAIR DISKUS) 100-50 MCG/DOSE AEPB Inhale 1 puff into the lungs in the morning and at bedtime. 60 each 0  . meloxicam (MOBIC) 15 MG tablet Take 15 mg by mouth daily as needed for pain.    Marland Kitchen losartan (COZAAR) 100 MG tablet Take 1 tablet (100 mg total) by mouth daily. 90 tablet 0   No facility-administered medications prior to visit.    Allergies  Allergen Reactions  .  Wheat Bran   . Gluten Meal Other (See Comments)    Vomiting, anorexia    ROS Review of Systems A fourteen system review of systems was performed and found to be positive as per HPI.   Objective:    Physical Exam General:  Well Developed, well nourished, appropriate for stated age.  Neuro:  Alert and oriented,  extra-ocular muscles intact  HEENT:  Normocephalic, atraumatic, neck supple  Skin:  no gross rash, warm, pink. Cardiac:  RRR, S1 S2 wnl's  Respiratory:  ECTA B/L w/o wheezing, Not using accessory muscles, speaking in full sentences- unlabored. Vascular:  Ext warm, no cyanosis apprec.; cap RF less 2 sec. Psych:  No HI/SI, judgement and insight good, Euthymic mood. Full Affect.   BP 130/81   Pulse 84   Temp (!) 97.4 F (36.3 C)   Ht 5\' 6"  (1.676 m)   Wt 203 lb 11.2 oz (92.4 kg)   LMP 04/12/2016 (Approximate)    SpO2 97%   BMI 32.88 kg/m  Wt Readings from Last 3 Encounters:  07/09/20 203 lb 11.2 oz (92.4 kg)  05/07/20 191 lb (86.6 kg)  04/15/20 192 lb (87.1 kg)     Health Maintenance Due  Topic Date Due  . Hepatitis C Screening  Never done  . HIV Screening  Never done  . TETANUS/TDAP  Never done    There are no preventive care reminders to display for this patient.  Lab Results  Component Value Date   TSH 1.870 12/07/2019   Lab Results  Component Value Date   WBC 5.2 12/07/2019   HGB 13.5 12/07/2019   HCT 41.3 12/07/2019   MCV 93 12/07/2019   PLT 155 12/07/2019   Lab Results  Component Value Date   NA 141 12/07/2019   K 4.5 12/07/2019   CO2 26 12/07/2019   GLUCOSE 101 (H) 12/07/2019   BUN 11 12/07/2019   CREATININE 0.80 12/07/2019   BILITOT 0.5 12/07/2019   ALKPHOS 76 12/07/2019   AST 20 12/07/2019   ALT 26 12/07/2019   PROT 6.3 12/07/2019   ALBUMIN 4.3 12/07/2019   CALCIUM 9.2 12/07/2019   Lab Results  Component Value Date   CHOL 173 12/07/2019   Lab Results  Component Value Date   HDL 43 12/07/2019   Lab Results  Component Value Date   LDLCALC 112 (H) 12/07/2019   Lab Results  Component Value Date   TRIG 99 12/07/2019   Lab Results  Component Value Date   CHOLHDL 4.0 12/07/2019   Lab Results  Component Value Date   HGBA1C 5.6 12/07/2019      Assessment & Plan:   Problem List Items Addressed This Visit      Cardiovascular and Mediastinum   Essential hypertension - Primary    -Controlled. -Continue current medication regimen. Provided refill. Last CMP normal. -Will continue to monitor.      Relevant Medications   losartan (COZAAR) 100 MG tablet     Other   Situational anxiety    -Stable. -PDMP reviewed no aberrancies noted. Patient does not need refill at this time. -Will continue to monitor.       Other Visit Diagnoses    History of mitral valve prolapse         History of mitral valve prolapse: -Asymptomatic. -Discussed  with patient repeating EKG with annual CPE in 12/2020.   Meds ordered this encounter  Medications  . losartan (COZAAR) 100 MG tablet    Sig: Take 1 tablet (  100 mg total) by mouth daily.    Dispense:  90 tablet    Refill:  1    Follow-up: Return in about 6 months (around 01/09/2021) for CPE and FBW.    Lorrene Reid, PA-C

## 2020-07-09 NOTE — Assessment & Plan Note (Signed)
-  Stable. -PDMP reviewed no aberrancies noted. Patient does not need refill at this time. -Will continue to monitor.

## 2020-07-09 NOTE — Patient Instructions (Signed)

## 2020-10-20 ENCOUNTER — Telehealth: Payer: Self-pay | Admitting: Physician Assistant

## 2020-10-20 NOTE — Telephone Encounter (Signed)
Patient would like to know a good doctor for herniated disk in her back. She assumes it is from a sciatic nerve. Please advise, thanks.

## 2020-10-22 ENCOUNTER — Telehealth: Payer: Self-pay | Admitting: Physician Assistant

## 2020-10-22 NOTE — Telephone Encounter (Signed)
Patient states she needs meloxicam for her back and she is scheduled on August 4 for her back doctor. Patient would like some of the medication to hold her over until then. [facility] is her pharmacy. Please advise, thanks.

## 2020-10-23 ENCOUNTER — Encounter: Payer: Self-pay | Admitting: Physician Assistant

## 2020-10-23 ENCOUNTER — Ambulatory Visit (INDEPENDENT_AMBULATORY_CARE_PROVIDER_SITE_OTHER): Payer: 59 | Admitting: Physician Assistant

## 2020-10-23 ENCOUNTER — Other Ambulatory Visit: Payer: Self-pay

## 2020-10-23 VITALS — BP 159/87 | HR 71 | Temp 97.4°F | Ht 66.0 in | Wt 196.3 lb

## 2020-10-23 DIAGNOSIS — M545 Low back pain, unspecified: Secondary | ICD-10-CM | POA: Diagnosis not present

## 2020-10-23 MED ORDER — METHYLPREDNISOLONE 4 MG PO TBPK: ORAL_TABLET | ORAL | 0 refills | Status: DC

## 2020-10-23 MED ORDER — MELOXICAM 15 MG PO TABS: 15.0000 mg | ORAL_TABLET | Freq: Every day | ORAL | 0 refills | Status: DC | PRN

## 2020-10-23 NOTE — Patient Instructions (Signed)
https://doi.[email or web address]/[email or web address]">  Chronic Back Pain When back pain lasts longer than 3 months, it is called chronic back pain. The cause of your back pain may not be known. Some common causes include: Wear and tear (degenerative disease) of the bones, ligaments, or disks in your back. Inflammation and stiffness in your back (arthritis). People who have chronic back pain often go through certain periods in which the pain is more intense (flare-ups). Many people can learn to manage the pain with home care. Follow these instructions at home: Pay attention to any changes in your symptoms. Take these actions to help withyour pain: Managing pain and stiffness     If directed, apply ice to the painful area. Your health care provider may recommend applying ice during the first 24-48 hours after a flare-up begins. To do this: Put ice in a plastic bag. Place a towel between your skin and the bag. Leave the ice on for 20 minutes, 2-3 times per day. If directed, apply heat to the affected area as often as told by your health care provider. Use the heat source that your health care provider recommends, such as a moist heat pack or a heating pad. Place a towel between your skin and the heat source. Leave the heat on for 20-30 minutes. Remove the heat if your skin turns bright red. This is especially important if you are unable to feel pain, heat, or cold. You may have a greater risk of getting burned. Try soaking in a warm tub. Activity  Avoid bending and other activities that make the problem worse. Maintain a proper position when standing or sitting: When standing, keep your upper back and neck straight, with your shoulders pulled back. Avoid slouching. When sitting, keep your back straight and relax your shoulders. Do not round your shoulders or pull them backward. Do not sit or stand in one place for long periods of time. Take brief periods of rest throughout the day. This will reduce your  pain. Resting in a lying or standing position is usually better than sitting to rest. When you are resting for longer periods, mix in some mild activity or stretching between periods of rest. This will help to prevent stiffness and pain. Get regular exercise. Ask your health care provider what activities are safe for you. Do not lift anything that is heavier than 10 lb (4.5 kg), or the limit that you are told, until your health care provider says that it is safe. Always use proper lifting technique, which includes: Bending your knees. Keeping the load close to your body. Avoiding twisting. Sleep on a firm mattress in a comfortable position. Try lying on your side with your knees slightly bent. If you lie on your back, put a pillow under your knees.  Medicines Treatment may include medicines for pain and inflammation taken by mouth or applied to the skin, prescription pain medicine, or muscle relaxants. Take over-the-counter and prescription medicines only as told by your health care provider. Ask your health care provider if the medicine prescribed to you: Requires you to avoid driving or using machinery. Can cause constipation. You may need to take these actions to prevent or treat constipation: Drink enough fluid to keep your urine pale yellow. Take over-the-counter or prescription medicines. Eat foods that are high in fiber, such as beans, whole grains, and fresh fruits and vegetables. Limit foods that are high in fat and processed sugars, such as fried or sweet foods. General instructions Do not use any products that contain   nicotine or tobacco, such as cigarettes, e-cigarettes, and chewing tobacco. If you need help quitting, ask your health care provider. Keep all follow-up visits as told by your health care provider. This is important. Contact a health care provider if: You have pain that is not relieved with rest or medicine. Your pain gets worse, or you have new pain. You have a high  fever. You have rapid weight loss. You have trouble doing your normal activities. Get help right away if: You have weakness or numbness in one or both of your legs or feet. You have trouble controlling your bladder or your bowels. You have severe back pain and have any of the following: Nausea or vomiting. Pain in your abdomen. Shortness of breath or you faint. Summary Chronic back pain is back pain that lasts longer than 3 months. When a flare-up begins, apply ice to the painful area for the first 24-48 hours. Apply a moist heat pad or use a heating pad on the painful area as directed by your health care provider. When you are resting for longer periods, mix in some mild activity or stretching between periods of rest. This will help to prevent stiffness and pain. This information is not intended to replace advice given to you by your health care provider. Make sure you discuss any questions you have with your healthcare provider. Document Revised: 05/09/2019 Document Reviewed: 05/09/2019 Elsevier Patient Education  2022 Elsevier Inc.  

## 2020-10-23 NOTE — Progress Notes (Signed)
Established Patient Office Visit  Subjective:  Patient ID: Diamond Trujillo, female    DOB: 07-20-1960  Age: 60 y.o. MRN: [id number]  CC:  Chief Complaint  Patient presents with   Back Pain    HPI Diamond Trujillo presents for c/o back pain.  Patient reports intermittent back pain for the past 2 months but recently purchased a room which she and reports Monday morning woke up with severe pain which was different from prior episodes.  States pain is usually worse in the mornings when getting out of bed, describes the pain as sharp.  Also reports intermittent left hip throbbing pain.  Pain can radiate down her leg, right greater than left.  Also reports numbness and tingling sensation of her feet.  Patient has an appointment scheduled with Dr. Layne Benton 11/13/2020.  Patient has a history of disc herniation at L4-5, L5-S1 and received epidural injection for symptom relief. Has been taking meloxicam as needed for pain relief. Denies fever, chills bowel or bladder dysfunction.  Past Medical History:  Diagnosis Date   Allergy    Phreesia 01/09/2020   Anxiety    Cancer ([place])    melanoma   Celiac disease    Hiatal hernia    Hypertension    MVP (mitral valve prolapse)     Past Surgical History:  Procedure Laterality Date   CESAREAN SECTION     x 3   MELANOMA EXCISION     MENISCUS REPAIR Right    TRIGGER FINGER RELEASE     Left Thumb     Family History  Problem Relation Age of Onset   Cancer Mother        liver, lung, brain   Hypertension Mother    Alcohol abuse Mother     Social History   Socioeconomic History   Marital status: Married    Spouse name: Not on file   Number of children: Not on file   Years of education: Not on file   Highest education level: Not on file  Occupational History   Not on file  Tobacco Use   Smoking status: Never   Smokeless tobacco: Never  Vaping Use   Vaping Use: Never used  Substance and Sexual Activity   Alcohol use: Yes    Alcohol/week:  5.0 standard drinks    Types: 5 Standard drinks or equivalent per week   Drug use: Never   Sexual activity: Yes    Birth control/protection: Post-menopausal  Other Topics Concern   Not on file  Social History Narrative   Not on file   Social Determinants of Health   Financial Resource Strain: Not on file  Food Insecurity: Not on file  Transportation Needs: Not on file  Physical Activity: Not on file  Stress: Not on file  Social Connections: Not on file  Intimate Partner Violence: Not on file    Outpatient Medications Prior to Visit  Medication Sig Dispense Refill   albuterol (VENTOLIN HFA) 108 (90 Base) MCG/ACT inhaler Inhale 2 puffs into the lungs every 6 (six) hours as needed for wheezing or shortness of breath. 1 Inhaler 1   ALPRAZolam (XANAX) 0.25 MG tablet Take 1 tablet (0.25 mg total) by mouth as needed for anxiety. Take 1 tablet prior to flying 10 tablet 0   esomeprazole (NEXIUM) 20 MG packet Take 20 mg by mouth daily before breakfast.     Fluticasone-Salmeterol (ADVAIR DISKUS) 100-50 MCG/DOSE AEPB Inhale 1 puff into the lungs in the morning and at  bedtime. 60 each 0   losartan (COZAAR) 100 MG tablet Take 1 tablet (100 mg total) by mouth daily. 90 tablet 1   meloxicam (MOBIC) 15 MG tablet Take 15 mg by mouth daily as needed for pain.     No facility-administered medications prior to visit.    Allergies  Allergen Reactions   Wheat Bran    Gluten Meal Other (See Comments)    Vomiting, anorexia    ROS Review of Systems Review of Systems:  A fourteen system review of systems was performed and found to be positive as per HPI.   Objective:    Physical Exam General:  Well Developed, well nourished, appropriate for stated age.  Neuro:  Alert and oriented,  extra-ocular muscles intact  HEENT:  Normocephalic, atraumatic, neck supple, no carotid bruits appreciated  Skin:  no gross rash, warm, pink. Cardiac:  RRR, S1 S2 Respiratory:  ECTA B/L, Not using accessory  muscles, speaking in full sentences- unlabored. MSK: Tenderness of low back b/l with some tenderness near left hip, some discomfort with spine ROM Vascular:  Ext warm, no cyanosis apprec.; cap RF less 2 sec. Psych:  No HI/SI, judgement and insight good, Euthymic mood. Full Affect.  BP (!) 159/87   Pulse 71   Temp (!) 97.4 F (36.3 C)   Ht 5\' 6"  (1.676 m)   Wt 196 lb 4.8 oz (89 kg)   LMP 04/12/2016 (Approximate)   SpO2 98%   BMI 31.68 kg/m  Wt Readings from Last 3 Encounters:  10/23/20 196 lb 4.8 oz (89 kg)  07/09/20 203 lb 11.2 oz (92.4 kg)  05/07/20 191 lb (86.6 kg)     Health Maintenance Due  Topic Date Due   COVID-19 Vaccine (1) Never done   Pneumococcal Vaccine 13-79 Years old (1 - PCV) Never done   HIV Screening  Never done   Hepatitis C Screening  Never done   TETANUS/TDAP  Never done   Zoster Vaccines- Shingrix (1 of 2) Never done    There are no preventive care reminders to display for this patient.  Lab Results  Component Value Date   TSH 1.870 12/07/2019   Lab Results  Component Value Date   WBC 5.2 12/07/2019   HGB 13.5 12/07/2019   HCT 41.3 12/07/2019   MCV 93 12/07/2019   PLT 155 12/07/2019   Lab Results  Component Value Date   NA 141 12/07/2019   K 4.5 12/07/2019   CO2 26 12/07/2019   GLUCOSE 101 (H) 12/07/2019   BUN 11 12/07/2019   CREATININE 0.80 12/07/2019   BILITOT 0.5 12/07/2019   ALKPHOS 76 12/07/2019   AST 20 12/07/2019   ALT 26 12/07/2019   PROT 6.3 12/07/2019   ALBUMIN 4.3 12/07/2019   CALCIUM 9.2 12/07/2019   Lab Results  Component Value Date   CHOL 173 12/07/2019   Lab Results  Component Value Date   HDL 43 12/07/2019   Lab Results  Component Value Date   LDLCALC 112 (H) 12/07/2019   Lab Results  Component Value Date   TRIG 99 12/07/2019   Lab Results  Component Value Date   CHOLHDL 4.0 12/07/2019   Lab Results  Component Value Date   HGBA1C 5.6 12/07/2019      Assessment & Plan:   Problem List Items  Addressed This Visit   None Visit Diagnoses     Acute low back pain without sciatica, unspecified back pain laterality    -  Primary  Relevant Medications   methylPREDNISolone (MEDROL DOSEPAK) 4 MG TBPK tablet   meloxicam (MOBIC) 15 MG tablet      Acute low back pain without sciatica: -Discussed with patient radiculopathy symptoms concerning for exacerbation of disc herniation. Recommend to follow up with Dr. Layne Benton as scheduled. Will start corticosteroid therapy and recommend to continue with heat/ice therapy. Will provide refill of meloxicam.  Meds ordered this encounter  Medications   methylPREDNISolone (MEDROL DOSEPAK) 4 MG TBPK tablet    Sig: Take as directed on package.    Dispense:  21 tablet    Refill:  0    Order Specific Question:   Supervising Provider    Answer:   Beatrice Lecher D [[id number]]   meloxicam (MOBIC) 15 MG tablet    Sig: Take 1 tablet (15 mg total) by mouth daily as needed for pain.    Dispense:  30 tablet    Refill:  0    Order Specific Question:   Supervising Provider    Answer:   Beatrice Lecher D [[id number]]    Follow-up: Return if symptoms worsen or fail to improve.   Note:  This note was prepared with assistance of Dragon voice recognition software. Occasional wrong-word or sound-a-like substitutions may have occurred due to the inherent limitations of voice recognition software.  Lorrene Reid, PA-C

## 2020-11-13 DIAGNOSIS — M47816 Spondylosis without myelopathy or radiculopathy, lumbar region: Secondary | ICD-10-CM | POA: Diagnosis not present

## 2020-11-13 DIAGNOSIS — M25552 Pain in left hip: Secondary | ICD-10-CM | POA: Diagnosis not present

## 2020-11-27 DIAGNOSIS — M5417 Radiculopathy, lumbosacral region: Secondary | ICD-10-CM | POA: Diagnosis not present

## 2020-11-27 DIAGNOSIS — M6281 Muscle weakness (generalized): Secondary | ICD-10-CM | POA: Diagnosis not present

## 2020-12-11 DIAGNOSIS — M5417 Radiculopathy, lumbosacral region: Secondary | ICD-10-CM | POA: Diagnosis not present

## 2020-12-11 DIAGNOSIS — M6281 Muscle weakness (generalized): Secondary | ICD-10-CM | POA: Diagnosis not present

## 2021-01-02 ENCOUNTER — Other Ambulatory Visit: Payer: 59

## 2021-01-05 DIAGNOSIS — M6281 Muscle weakness (generalized): Secondary | ICD-10-CM | POA: Diagnosis not present

## 2021-01-05 DIAGNOSIS — M5417 Radiculopathy, lumbosacral region: Secondary | ICD-10-CM | POA: Diagnosis not present

## 2021-01-09 ENCOUNTER — Encounter: Payer: 59 | Admitting: Physician Assistant

## 2021-01-14 ENCOUNTER — Other Ambulatory Visit: Payer: Self-pay | Admitting: Physician Assistant

## 2021-01-14 DIAGNOSIS — I10 Essential (primary) hypertension: Secondary | ICD-10-CM

## 2021-02-03 DIAGNOSIS — D225 Melanocytic nevi of trunk: Secondary | ICD-10-CM | POA: Diagnosis not present

## 2021-02-03 DIAGNOSIS — L570 Actinic keratosis: Secondary | ICD-10-CM | POA: Diagnosis not present

## 2021-02-03 DIAGNOSIS — D2272 Melanocytic nevi of left lower limb, including hip: Secondary | ICD-10-CM | POA: Diagnosis not present

## 2021-02-03 DIAGNOSIS — D2271 Melanocytic nevi of right lower limb, including hip: Secondary | ICD-10-CM | POA: Diagnosis not present

## 2021-04-30 ENCOUNTER — Other Ambulatory Visit: Payer: Self-pay

## 2021-04-30 DIAGNOSIS — Z0000 Encounter for general adult medical examination without abnormal findings: Secondary | ICD-10-CM

## 2021-04-30 DIAGNOSIS — Z1321 Encounter for screening for nutritional disorder: Secondary | ICD-10-CM

## 2021-04-30 DIAGNOSIS — E7841 Elevated Lipoprotein(a): Secondary | ICD-10-CM

## 2021-04-30 DIAGNOSIS — Z130 Encounter for screening for diseases of the blood and blood-forming organs and certain disorders involving the immune mechanism: Secondary | ICD-10-CM

## 2021-04-30 DIAGNOSIS — I10 Essential (primary) hypertension: Secondary | ICD-10-CM

## 2021-05-01 ENCOUNTER — Other Ambulatory Visit: Payer: BC Managed Care – PPO

## 2021-05-01 ENCOUNTER — Other Ambulatory Visit: Payer: Self-pay

## 2021-05-01 DIAGNOSIS — Z1321 Encounter for screening for nutritional disorder: Secondary | ICD-10-CM | POA: Diagnosis not present

## 2021-05-01 DIAGNOSIS — Z0000 Encounter for general adult medical examination without abnormal findings: Secondary | ICD-10-CM | POA: Diagnosis not present

## 2021-05-01 DIAGNOSIS — E7841 Elevated Lipoprotein(a): Secondary | ICD-10-CM | POA: Diagnosis not present

## 2021-05-01 DIAGNOSIS — I10 Essential (primary) hypertension: Secondary | ICD-10-CM | POA: Diagnosis not present

## 2021-05-01 DIAGNOSIS — Z1329 Encounter for screening for other suspected endocrine disorder: Secondary | ICD-10-CM | POA: Diagnosis not present

## 2021-05-01 DIAGNOSIS — Z130 Encounter for screening for diseases of the blood and blood-forming organs and certain disorders involving the immune mechanism: Secondary | ICD-10-CM

## 2021-05-01 DIAGNOSIS — Z13228 Encounter for screening for other metabolic disorders: Secondary | ICD-10-CM

## 2021-05-02 LAB — CBC WITH DIFFERENTIAL/PLATELET
Basophils Absolute: 0 10*3/uL (ref 0.0–0.2)
Basos: 1 %
EOS (ABSOLUTE): 0.1 10*3/uL (ref 0.0–0.4)
Eos: 2 %
Hematocrit: 42.2 % (ref 34.0–46.6)
Hemoglobin: 14.3 g/dL (ref 11.1–15.9)
Immature Grans (Abs): 0 10*3/uL (ref 0.0–0.1)
Immature Granulocytes: 0 %
Lymphocytes Absolute: 1.3 10*3/uL (ref 0.7–3.1)
Lymphs: 24 %
MCH: 30 pg (ref 26.6–33.0)
MCHC: 33.9 g/dL (ref 31.5–35.7)
MCV: 89 fL (ref 79–97)
Monocytes Absolute: 0.4 10*3/uL (ref 0.1–0.9)
Monocytes: 7 %
Neutrophils Absolute: 3.7 10*3/uL (ref 1.4–7.0)
Neutrophils: 66 %
Platelets: 171 10*3/uL (ref 150–450)
RBC: 4.76 x10E6/uL (ref 3.77–5.28)
RDW: 12.3 % (ref 11.7–15.4)
WBC: 5.6 10*3/uL (ref 3.4–10.8)

## 2021-05-02 LAB — COMPREHENSIVE METABOLIC PANEL
ALT: 41 IU/L — ABNORMAL HIGH (ref 0–32)
AST: 19 IU/L (ref 0–40)
Albumin/Globulin Ratio: 2.4 — ABNORMAL HIGH (ref 1.2–2.2)
Albumin: 4.8 g/dL (ref 3.8–4.9)
Alkaline Phosphatase: 85 IU/L (ref 44–121)
BUN/Creatinine Ratio: 20 (ref 9–23)
BUN: 15 mg/dL (ref 6–24)
Bilirubin Total: 0.7 mg/dL (ref 0.0–1.2)
CO2: 27 mmol/L (ref 20–29)
Calcium: 9.3 mg/dL (ref 8.7–10.2)
Chloride: 101 mmol/L (ref 96–106)
Creatinine, Ser: 0.76 mg/dL (ref 0.57–1.00)
Globulin, Total: 2 g/dL (ref 1.5–4.5)
Glucose: 94 mg/dL (ref 70–99)
Potassium: 5.1 mmol/L (ref 3.5–5.2)
Sodium: 140 mmol/L (ref 134–144)
Total Protein: 6.8 g/dL (ref 6.0–8.5)
eGFR: 90 mL/min/{1.73_m2} (ref >59)

## 2021-05-02 LAB — LIPID PANEL
Chol/HDL Ratio: 4.4 ratio (ref 0.0–4.4)
Cholesterol, Total: 190 mg/dL (ref 100–199)
HDL: 43 mg/dL (ref >39)
LDL Chol Calc (NIH): 120 mg/dL — ABNORMAL HIGH (ref 0–99)
Triglycerides: 151 mg/dL — ABNORMAL HIGH (ref 0–149)
VLDL Cholesterol Cal: 27 mg/dL (ref 5–40)

## 2021-05-02 LAB — TSH: TSH: 1.44 u[IU]/mL (ref 0.450–4.500)

## 2021-05-02 LAB — HEMOGLOBIN A1C
Est. average glucose Bld gHb Est-mCnc: 123 mg/dL
Hgb A1c MFr Bld: 5.9 % — ABNORMAL HIGH (ref 4.8–5.6)

## 2021-05-04 NOTE — Progress Notes (Signed)
Complete physical exam   Patient: Diamond Trujillo   DOB: 1960-04-25   61 y.o. Female  MRN: [id number] Visit Date: 05/05/2021   Chief Complaint  Patient presents with   Annual Exam   Subjective    Diamond Trujillo is a 61 y.o. female who presents today for a complete physical exam.  She reports consuming a general diet.  Has started to exercise again, light weights.  She generally feels fairly well. She reports headache x few weeks which has not completely resolved. Has been taking Excedrin and Tylenol as needed. Reports hx of migraine which has been managing with avoiding headache triggers and Excedrin usually helps. Also reports has completely quit drinking alcohol.     Past Medical History:  Diagnosis Date   Allergy    Phreesia 01/09/2020   Anxiety    Cancer ([place])    melanoma   Celiac disease    Hiatal hernia    Hypertension    MVP (mitral valve prolapse)    Past Surgical History:  Procedure Laterality Date   CESAREAN SECTION     x 3   MELANOMA EXCISION     MENISCUS REPAIR Right    TRIGGER FINGER RELEASE     Left Thumb    Social History   Socioeconomic History   Marital status: Married    Spouse name: Not on file   Number of children: Not on file   Years of education: Not on file   Highest education level: Not on file  Occupational History   Not on file  Tobacco Use   Smoking status: Never   Smokeless tobacco: Never  Vaping Use   Vaping Use: Never used  Substance and Sexual Activity   Alcohol use: Yes    Alcohol/week: 5.0 standard drinks    Types: 5 Standard drinks or equivalent per week   Drug use: Never   Sexual activity: Yes    Birth control/protection: Post-menopausal  Other Topics Concern   Not on file  Social History Narrative   Not on file   Social Determinants of Health   Financial Resource Strain: Not on file  Food Insecurity: Not on file  Transportation Needs: Not on file  Physical Activity: Not on file  Stress: Not on file  Social  Connections: Not on file  Intimate Partner Violence: Not on file     Medications: Outpatient Medications Prior to Visit  Medication Sig   albuterol (VENTOLIN HFA) 108 (90 Base) MCG/ACT inhaler Inhale 2 puffs into the lungs every 6 (six) hours as needed for wheezing or shortness of breath.   esomeprazole (NEXIUM) 20 MG packet Take 20 mg by mouth daily before breakfast.   Fluticasone-Salmeterol (ADVAIR DISKUS) 100-50 MCG/DOSE AEPB Inhale 1 puff into the lungs in the morning and at bedtime.   losartan (COZAAR) 100 MG tablet TAKE 1 TABLET (100 MG TOTAL) BY MOUTH DAILY.   [DISCONTINUED] ALPRAZolam (XANAX) 0.25 MG tablet Take 1 tablet (0.25 mg total) by mouth as needed for anxiety. Take 1 tablet prior to flying   [DISCONTINUED] meloxicam (MOBIC) 15 MG tablet Take 1 tablet (15 mg total) by mouth daily as needed for pain.   [DISCONTINUED] methylPREDNISolone (MEDROL DOSEPAK) 4 MG TBPK tablet Take as directed on package.   No facility-administered medications prior to visit.    Review of Systems Review of Systems:  A fourteen system review of systems was performed and found to be positive as per HPI.   Objective    BP 134/83  Pulse 68    Temp 98 F (36.7 C)    Ht 5\' 6"  (1.676 m)    Wt 196 lb (88.9 kg)    LMP 04/12/2016 (Approximate)    SpO2 100%    BMI 31.64 kg/m    Physical Exam   General Appearance:    Well developed, well nourished female. Alert, cooperative, in no acute distress, appears stated age   Head:    Normocephalic, without obvious abnormality, atraumatic  Eyes:    PERRL, conjunctiva/corneas clear, EOM's intact, fundi    benign, both eyes  Ears:    Normal TM's and external ear canals, both ears  Nose:   Nares normal, septum midline, mucosa normal, no drainage    or sinus tenderness  Throat:   Lips, mucosa, and tongue normal; teeth and gums normal  Neck:   Supple, symmetrical, trachea midline, no adenopathy;    thyroid:  no enlargement/tenderness/nodules; no JVD  Back:      Symmetric, no curvature, ROM normal, no CVA tenderness  Lungs:     Clear to auscultation bilaterally, respirations unlabored  Chest Wall:    No tenderness or deformity   Heart:    Normal heart rate. Normal rhythm. Normal heart sounds.   Breast Exam:    deferred  Abdomen:     Soft, non-tender, bowel sounds active all four quadrants,    no masses, no organomegaly  Pelvic:    deferred  Extremities:   All extremities are intact. No cyanosis or edema  Pulses:   2+ and symmetric all extremities  Skin:   Skin color, texture, turgor normal, no rashes or lesions  Lymph nodes:   Cervical, supraclavicular, and axillary nodes normal  Neurologic:   CNII-XII grossly intact     Last depression screening scores PHQ 2/9 Scores 05/05/2021 10/23/2020 07/09/2020  PHQ - 2 Score 0 0 0  PHQ- 9 Score 0 0 0   Last fall risk screening Fall Risk  05/05/2021  Falls in the past year? 0  Number falls in past yr: 0  Injury with Fall? 0  Risk for fall due to : No Fall Risks  Follow up Falls evaluation completed    No results found for any visits on 05/05/21.  Assessment & Plan    Routine Health Maintenance and Physical Exam  Exercise Activities and Dietary recommendations -Heart healthy diet low in fat and carbohydrates. Moderate exercise 150 mins/wk.     There is no immunization history on file for this patient.  Health Maintenance  Topic Date Due   COVID-19 Vaccine (1) Never done   HIV Screening  Never done   Hepatitis C Screening  Never done   TETANUS/TDAP  Never done   Zoster Vaccines- Shingrix (1 of 2) Never done   INFLUENZA VACCINE  Never done   PAP SMEAR-Modifier  11/09/2021   MAMMOGRAM  01/24/2022   COLONOSCOPY (Pts 45-69yrs Insurance coverage will need to be confirmed)  01/15/2027   HPV VACCINES  Aged Out    Discussed health benefits of physical activity, and encouraged her to engage in regular exercise appropriate for her age and condition.  Problem List Items Addressed This Visit        Other   Healthcare maintenance - Primary   Situational anxiety   Relevant Medications   ALPRAZolam (XANAX) 0.25 MG tablet   Elevated lipoprotein(a)   Other Visit Diagnoses     History of mitral valve prolapse       Relevant Orders   EKG  12-Lead   Acute low back pain without sciatica, unspecified back pain laterality       Relevant Medications   meloxicam (MOBIC) 15 MG tablet   Intractable migraine without aura and without status migrainosus       Relevant Medications   meloxicam (MOBIC) 15 MG tablet   Encounter for screening mammogram for malignant neoplasm of breast       Relevant Orders   MM Digital Screening      Discussed with patient most recent lab results which are essentially within normal limits or stable from prior with the exception of A1c and lipid panel. ALT mildly elevated. Will repeat labs in 6 months. Discussed with patient avoid hepatic toxic substances. Encourage to continue with alcohol cessation, denies withdrawal symptoms of confusion and tremor.   Provided Nurtec sample for acute migraine headache.  Will place order for screening mammogram.   Patient plans to get established with a new OB/GYN for routine female exam.  EKG obtained due to hx of MVP, normal sinus rhythm, rate 64 bpm, no acute ST-T wave changes. No major changes when compared to previous EKGs.   Return in about 6 months (around 11/02/2021) for HTN, HLD, prediabetes and FBW(cmp, lipid panel, a1c) few days prior.        Lorrene Reid, PA-C  [facility] at [facility] [phone] (phone) [phone] (fax)  [place]

## 2021-05-05 ENCOUNTER — Ambulatory Visit (INDEPENDENT_AMBULATORY_CARE_PROVIDER_SITE_OTHER): Payer: BC Managed Care – PPO | Admitting: Physician Assistant

## 2021-05-05 ENCOUNTER — Encounter: Payer: Self-pay | Admitting: Physician Assistant

## 2021-05-05 ENCOUNTER — Other Ambulatory Visit: Payer: Self-pay

## 2021-05-05 VITALS — BP 134/83 | HR 68 | Temp 98.0°F | Ht 66.0 in | Wt 196.0 lb

## 2021-05-05 DIAGNOSIS — Z0000 Encounter for general adult medical examination without abnormal findings: Secondary | ICD-10-CM

## 2021-05-05 DIAGNOSIS — G43019 Migraine without aura, intractable, without status migrainosus: Secondary | ICD-10-CM

## 2021-05-05 DIAGNOSIS — E7841 Elevated Lipoprotein(a): Secondary | ICD-10-CM | POA: Diagnosis not present

## 2021-05-05 DIAGNOSIS — M545 Low back pain, unspecified: Secondary | ICD-10-CM | POA: Diagnosis not present

## 2021-05-05 DIAGNOSIS — Z1231 Encounter for screening mammogram for malignant neoplasm of breast: Secondary | ICD-10-CM

## 2021-05-05 DIAGNOSIS — F418 Other specified anxiety disorders: Secondary | ICD-10-CM

## 2021-05-05 DIAGNOSIS — Z8679 Personal history of other diseases of the circulatory system: Secondary | ICD-10-CM

## 2021-05-05 MED ORDER — ALPRAZOLAM 0.25 MG PO TABS: 0.2500 mg | ORAL_TABLET | ORAL | 0 refills | Status: DC | PRN

## 2021-05-05 MED ORDER — MELOXICAM 15 MG PO TABS: 15.0000 mg | ORAL_TABLET | Freq: Every day | ORAL | 0 refills | Status: DC | PRN

## 2021-05-05 NOTE — Patient Instructions (Signed)

## 2021-05-06 LAB — EKG 12-LEAD

## 2021-05-22 DIAGNOSIS — H2513 Age-related nuclear cataract, bilateral: Secondary | ICD-10-CM | POA: Diagnosis not present

## 2021-05-22 DIAGNOSIS — H43393 Other vitreous opacities, bilateral: Secondary | ICD-10-CM | POA: Diagnosis not present

## 2021-05-22 DIAGNOSIS — D3132 Benign neoplasm of left choroid: Secondary | ICD-10-CM | POA: Diagnosis not present

## 2021-05-22 DIAGNOSIS — H43811 Vitreous degeneration, right eye: Secondary | ICD-10-CM | POA: Diagnosis not present

## 2021-05-22 DIAGNOSIS — H524 Presbyopia: Secondary | ICD-10-CM | POA: Diagnosis not present

## 2021-06-19 ENCOUNTER — Ambulatory Visit
Admission: RE | Admit: 2021-06-19 | Discharge: 2021-06-19 | Disposition: A | Discharge status: Home / Self Care | Payer: BC Managed Care – PPO | Source: Ambulatory Visit | Attending: Physician Assistant | Admitting: Physician Assistant

## 2021-06-19 DIAGNOSIS — Z1231 Encounter for screening mammogram for malignant neoplasm of breast: Secondary | ICD-10-CM | POA: Diagnosis not present

## 2021-06-26 DIAGNOSIS — M25552 Pain in left hip: Secondary | ICD-10-CM | POA: Diagnosis not present

## 2021-06-26 DIAGNOSIS — M25551 Pain in right hip: Secondary | ICD-10-CM | POA: Diagnosis not present

## 2021-06-26 DIAGNOSIS — M898X5 Other specified disorders of bone, thigh: Secondary | ICD-10-CM | POA: Diagnosis not present

## 2021-06-29 DIAGNOSIS — M792 Neuralgia and neuritis, unspecified: Secondary | ICD-10-CM | POA: Diagnosis not present

## 2021-06-29 DIAGNOSIS — M2011 Hallux valgus (acquired), right foot: Secondary | ICD-10-CM | POA: Diagnosis not present

## 2021-07-22 ENCOUNTER — Other Ambulatory Visit: Payer: Self-pay | Admitting: Physician Assistant

## 2021-07-22 DIAGNOSIS — I10 Essential (primary) hypertension: Secondary | ICD-10-CM

## 2021-07-23 DIAGNOSIS — M65311 Trigger thumb, right thumb: Secondary | ICD-10-CM | POA: Diagnosis not present

## 2021-08-25 DIAGNOSIS — M65311 Trigger thumb, right thumb: Secondary | ICD-10-CM | POA: Diagnosis not present

## 2021-10-23 ENCOUNTER — Other Ambulatory Visit: Payer: Self-pay | Admitting: Physician Assistant

## 2021-10-23 DIAGNOSIS — E7841 Elevated Lipoprotein(a): Secondary | ICD-10-CM

## 2021-10-23 DIAGNOSIS — Z0000 Encounter for general adult medical examination without abnormal findings: Secondary | ICD-10-CM

## 2021-10-23 DIAGNOSIS — I10 Essential (primary) hypertension: Secondary | ICD-10-CM

## 2021-10-30 ENCOUNTER — Other Ambulatory Visit: Payer: BC Managed Care – PPO

## 2021-11-02 ENCOUNTER — Ambulatory Visit: Payer: BC Managed Care – PPO | Admitting: Physician Assistant

## 2021-11-10 DIAGNOSIS — M65311 Trigger thumb, right thumb: Secondary | ICD-10-CM | POA: Diagnosis not present

## 2021-12-09 DIAGNOSIS — Z85820 Personal history of malignant melanoma of skin: Secondary | ICD-10-CM | POA: Diagnosis not present

## 2021-12-09 DIAGNOSIS — D2271 Melanocytic nevi of right lower limb, including hip: Secondary | ICD-10-CM | POA: Diagnosis not present

## 2021-12-09 DIAGNOSIS — L738 Other specified follicular disorders: Secondary | ICD-10-CM | POA: Diagnosis not present

## 2021-12-09 DIAGNOSIS — D225 Melanocytic nevi of trunk: Secondary | ICD-10-CM | POA: Diagnosis not present

## 2021-12-09 DIAGNOSIS — L814 Other melanin hyperpigmentation: Secondary | ICD-10-CM | POA: Diagnosis not present

## 2021-12-09 DIAGNOSIS — D485 Neoplasm of uncertain behavior of skin: Secondary | ICD-10-CM | POA: Diagnosis not present

## 2021-12-16 DIAGNOSIS — D485 Neoplasm of uncertain behavior of skin: Secondary | ICD-10-CM | POA: Diagnosis not present

## 2021-12-16 DIAGNOSIS — Z85820 Personal history of malignant melanoma of skin: Secondary | ICD-10-CM | POA: Diagnosis not present

## 2021-12-16 DIAGNOSIS — L988 Other specified disorders of the skin and subcutaneous tissue: Secondary | ICD-10-CM | POA: Diagnosis not present

## 2022-01-18 ENCOUNTER — Other Ambulatory Visit: Payer: Self-pay | Admitting: Physician Assistant

## 2022-01-18 DIAGNOSIS — I10 Essential (primary) hypertension: Secondary | ICD-10-CM

## 2022-04-20 ENCOUNTER — Other Ambulatory Visit: Payer: Self-pay

## 2022-04-20 ENCOUNTER — Other Ambulatory Visit: Payer: 59

## 2022-04-20 DIAGNOSIS — I10 Essential (primary) hypertension: Secondary | ICD-10-CM

## 2022-04-20 DIAGNOSIS — Z13228 Encounter for screening for other metabolic disorders: Secondary | ICD-10-CM | POA: Diagnosis not present

## 2022-04-20 DIAGNOSIS — Z0000 Encounter for general adult medical examination without abnormal findings: Secondary | ICD-10-CM

## 2022-04-20 DIAGNOSIS — Z1321 Encounter for screening for nutritional disorder: Secondary | ICD-10-CM | POA: Diagnosis not present

## 2022-04-20 DIAGNOSIS — Z1329 Encounter for screening for other suspected endocrine disorder: Secondary | ICD-10-CM | POA: Diagnosis not present

## 2022-04-20 DIAGNOSIS — Z130 Encounter for screening for diseases of the blood and blood-forming organs and certain disorders involving the immune mechanism: Secondary | ICD-10-CM | POA: Diagnosis not present

## 2022-04-21 LAB — COMPREHENSIVE METABOLIC PANEL
ALT: 36 IU/L — ABNORMAL HIGH (ref 0–32)
AST: 19 IU/L (ref 0–40)
Albumin/Globulin Ratio: 1.9 (ref 1.2–2.2)
Albumin: 4.5 g/dL (ref 3.8–4.9)
Alkaline Phosphatase: 80 IU/L (ref 44–121)
BUN/Creatinine Ratio: 13 (ref 12–28)
BUN: 11 mg/dL (ref 8–27)
Bilirubin Total: 0.7 mg/dL (ref 0.0–1.2)
CO2: 25 mmol/L (ref 20–29)
Calcium: 9.3 mg/dL (ref 8.7–10.3)
Chloride: 102 mmol/L (ref 96–106)
Creatinine, Ser: 0.84 mg/dL (ref 0.57–1.00)
Globulin, Total: 2.4 g/dL (ref 1.5–4.5)
Glucose: 107 mg/dL — ABNORMAL HIGH (ref 70–99)
Potassium: 4.8 mmol/L (ref 3.5–5.2)
Sodium: 139 mmol/L (ref 134–144)
Total Protein: 6.9 g/dL (ref 6.0–8.5)
eGFR: 80 mL/min/{1.73_m2} (ref >59)

## 2022-04-21 LAB — HEMOGLOBIN A1C
Est. average glucose Bld gHb Est-mCnc: 123 mg/dL
Hgb A1c MFr Bld: 5.9 % — ABNORMAL HIGH (ref 4.8–5.6)

## 2022-04-21 LAB — LIPID PANEL
Chol/HDL Ratio: 4.6 ratio — ABNORMAL HIGH (ref 0.0–4.4)
Cholesterol, Total: 203 mg/dL — ABNORMAL HIGH (ref 100–199)
HDL: 44 mg/dL (ref >39)
LDL Chol Calc (NIH): 143 mg/dL — ABNORMAL HIGH (ref 0–99)
Triglycerides: 87 mg/dL (ref 0–149)
VLDL Cholesterol Cal: 16 mg/dL (ref 5–40)

## 2022-04-26 NOTE — Progress Notes (Signed)
Established patient visit   Patient: Diamond Trujillo   DOB: 1960/09/15   62 y.o. Female  MRN: [id number] Visit Date: 04/27/2022   Chief Complaint  Patient presents with   Pre-op Exam        Subjective    HPI HPI     Pre-op Exam    Additional comments:        Last edited by Gemma Payor, CMA on 04/27/2022  3:30 PM.      Follow up  -routine fasting labs done prior to this visit  --mild elevation of LDL and total cholesterol. Mild elevation HDL/Chol ratio --HgbA1c 5.9 with glucose 107 Surgical clearance - will have trigger finger release of right thumb on 05/19/2022 at [facility] surgical center. She will have procedure will be done under general anesthesia.  -ECG done 04/2021 was normal. Patient states that the surgery center would like most recent ECG.     Medications: Outpatient Medications Prior to Visit  Medication Sig   albuterol (VENTOLIN HFA) 108 (90 Base) MCG/ACT inhaler Inhale 2 puffs into the lungs every 6 (six) hours as needed for wheezing or shortness of breath.   esomeprazole (NEXIUM) 20 MG packet Take 20 mg by mouth daily before breakfast.   Fluticasone-Salmeterol (ADVAIR DISKUS) 100-50 MCG/DOSE AEPB Inhale 1 puff into the lungs in the morning and at bedtime.   losartan (COZAAR) 100 MG tablet TAKE 1 TABLET (100 MG TOTAL) BY MOUTH DAILY.   meloxicam (MOBIC) 15 MG tablet Take 1 tablet (15 mg total) by mouth daily as needed for pain.   [DISCONTINUED] ALPRAZolam (XANAX) 0.25 MG tablet Take 1 tablet (0.25 mg total) by mouth as needed for anxiety. Take 1 tablet prior to flying   No facility-administered medications prior to visit.    Review of Systems  Constitutional:  Negative for activity change, appetite change, chills, fatigue and fever.  HENT:  Negative for congestion, postnasal drip, rhinorrhea, sinus pressure, sinus pain, sneezing and sore throat.   Eyes: Negative.   Respiratory:  Negative for cough, chest tightness, shortness of breath and wheezing.    Cardiovascular:  Negative for chest pain and palpitations.  Gastrointestinal:  Negative for abdominal pain, constipation, diarrhea, nausea and vomiting.  Endocrine: Negative for cold intolerance, heat intolerance, polydipsia and polyuria.  Genitourinary:  Negative for dyspareunia, dysuria, flank pain, frequency and urgency.  Musculoskeletal:  Negative for arthralgias, back pain and myalgias.  Skin:  Negative for rash.  Allergic/Immunologic: Negative for environmental allergies.  Neurological:  Negative for dizziness, weakness and headaches.  Hematological:  Negative for adenopathy.  Psychiatric/Behavioral:  The patient is not nervous/anxious.     Last CBC Lab Results  Component Value Date   WBC 5.6 05/01/2021   HGB 14.3 05/01/2021   HCT 42.2 05/01/2021   MCV 89 05/01/2021   MCH 30.0 05/01/2021   RDW 12.3 05/01/2021   PLT 171 97/98/9211   Last metabolic panel Lab Results  Component Value Date   GLUCOSE 107 (H) 04/20/2022   NA 139 04/20/2022   K 4.8 04/20/2022   CL 102 04/20/2022   CO2 25 04/20/2022   BUN 11 04/20/2022   CREATININE 0.84 04/20/2022   EGFR 80 04/20/2022   CALCIUM 9.3 04/20/2022   PROT 6.9 04/20/2022   ALBUMIN 4.5 04/20/2022   LABGLOB 2.4 04/20/2022   AGRATIO 1.9 04/20/2022   BILITOT 0.7 04/20/2022   ALKPHOS 80 04/20/2022   AST 19 04/20/2022   ALT 36 (H) 04/20/2022   Last lipids Lab Results  Component  Value Date   CHOL 203 (H) 04/20/2022   HDL 44 04/20/2022   LDLCALC 143 (H) 04/20/2022   TRIG 87 04/20/2022   CHOLHDL 4.6 (H) 04/20/2022   Last hemoglobin A1c Lab Results  Component Value Date   HGBA1C 5.9 (H) 04/20/2022   Last thyroid functions Lab Results  Component Value Date   TSH 1.440 05/01/2021        Objective     Today's Vitals   04/27/22 1516 04/27/22 1551  BP: (Abnormal) 144/83 139/84  Pulse: 73 69  Resp: 18   Weight: 189 lb (85.7 kg)   Height: '5\' 6"'$  (1.676 m)    Body mass index is 30.51 kg/m.  BP Readings from Last  3 Encounters:  04/27/22 139/84  05/05/21 134/83  10/23/20 (Abnormal) 159/87    Wt Readings from Last 3 Encounters:  04/27/22 189 lb (85.7 kg)  05/05/21 196 lb (88.9 kg)  10/23/20 196 lb 4.8 oz (89 kg)    Physical Exam Vitals and nursing note reviewed.  Constitutional:      Appearance: Normal appearance. She is well-developed.  HENT:     Head: Normocephalic and atraumatic.     Nose: Nose normal.     Mouth/Throat:     Mouth: Mucous membranes are moist.     Pharynx: Oropharynx is clear.  Eyes:     Extraocular Movements: Extraocular movements intact.     Conjunctiva/sclera: Conjunctivae normal.     Pupils: Pupils are equal, round, and reactive to light.  Neck:     Vascular: No carotid bruit.  Cardiovascular:     Rate and Rhythm: Normal rate and regular rhythm.     Pulses: Normal pulses.     Heart sounds: Normal heart sounds.  Pulmonary:     Effort: Pulmonary effort is normal.     Breath sounds: Normal breath sounds.  Abdominal:     General: Bowel sounds are normal. There is no distension.     Palpations: Abdomen is soft. There is no mass.     Tenderness: There is no abdominal tenderness. There is no right CVA tenderness, left CVA tenderness, guarding or rebound.     Hernia: No hernia is present.  Musculoskeletal:        General: Normal range of motion.     Cervical back: Normal range of motion and neck supple.  Lymphadenopathy:     Cervical: No cervical adenopathy.  Skin:    General: Skin is warm and dry.     Capillary Refill: Capillary refill takes less than 2 seconds.  Neurological:     General: No focal deficit present.     Mental Status: She is alert and oriented to person, place, and time.  Psychiatric:        Mood and Affect: Mood normal.        Behavior: Behavior normal.        Thought Content: Thought content normal.        Judgment: Judgment normal.       Assessment & Plan    1. Preoperative clearance The patient is medically cleared with low risk to  have trigger finger release of right thumb under general anesthesia.   2. Essential hypertension Stable. Continue bp medication as prescribed   3. Elevated lipoprotein(a) Recommend patient limit intake of fried and fatty foods. She should increase intake of lean proteins and green leafy vegetables. Adding exercise into daily routine will also be beneficial.    4. Impaired fasting glucose Glucose 107 with  HgbA1c 5.9. recommend she limit intake of carbohydrates and sugar and increase water intake.   5. Situational anxiety May take alprazolam 0.25 mg daily as needed for acute anxiety.  - ALPRAZolam (XANAX) 0.25 MG tablet; Take 1 tablet (0.25 mg total) by mouth as needed for anxiety. Take 1 tablet prior to flying  Dispense: 30 tablet; Refill: 0   Problem List Items Addressed This Visit       Cardiovascular and Mediastinum   Essential hypertension     Endocrine   Impaired fasting glucose     Other   Situational anxiety   Relevant Medications   ALPRAZolam (XANAX) 0.25 MG tablet   Elevated lipoprotein(a)   Other Visit Diagnoses     Preoperative clearance    -  Primary        Return in about 1 year (around 04/28/2023) for health maintenance exam, FBW a week prior to visit.         Ronnell Freshwater, NP  [facility] at [facility] [phone] (phone) [phone] (fax)  [place]

## 2022-04-27 ENCOUNTER — Ambulatory Visit (INDEPENDENT_AMBULATORY_CARE_PROVIDER_SITE_OTHER): Payer: 59 | Admitting: Nurse Practitioner

## 2022-04-27 ENCOUNTER — Encounter: Payer: Self-pay | Admitting: Nurse Practitioner

## 2022-04-27 VITALS — BP 139/84 | HR 69 | Resp 18 | Ht 66.0 in | Wt 189.0 lb

## 2022-04-27 DIAGNOSIS — I10 Essential (primary) hypertension: Secondary | ICD-10-CM

## 2022-04-27 DIAGNOSIS — F418 Other specified anxiety disorders: Secondary | ICD-10-CM | POA: Diagnosis not present

## 2022-04-27 DIAGNOSIS — R69 Illness, unspecified: Secondary | ICD-10-CM | POA: Diagnosis not present

## 2022-04-27 DIAGNOSIS — Z01818 Encounter for other preprocedural examination: Secondary | ICD-10-CM

## 2022-04-27 DIAGNOSIS — R7301 Impaired fasting glucose: Secondary | ICD-10-CM | POA: Diagnosis not present

## 2022-04-27 DIAGNOSIS — E7841 Elevated Lipoprotein(a): Secondary | ICD-10-CM

## 2022-04-27 MED ORDER — ALPRAZOLAM 0.25 MG PO TABS
0.2500 mg | ORAL_TABLET | ORAL | 0 refills | Status: DC | PRN
Start: 2022-04-27 — End: 2023-02-09

## 2022-05-04 DIAGNOSIS — R7301 Impaired fasting glucose: Secondary | ICD-10-CM | POA: Insufficient documentation

## 2022-06-19 IMAGING — MG DIGITAL SCREENING BILAT W/ CAD
4 series · 4 of 4 positions shown · non-contrast
Comparison: None.
COMPARISON: None.

Addendum:
CLINICAL DATA: Screening.

EXAM:
DIGITAL SCREENING BILATERAL MAMMOGRAM WITH CAD

[R CC]
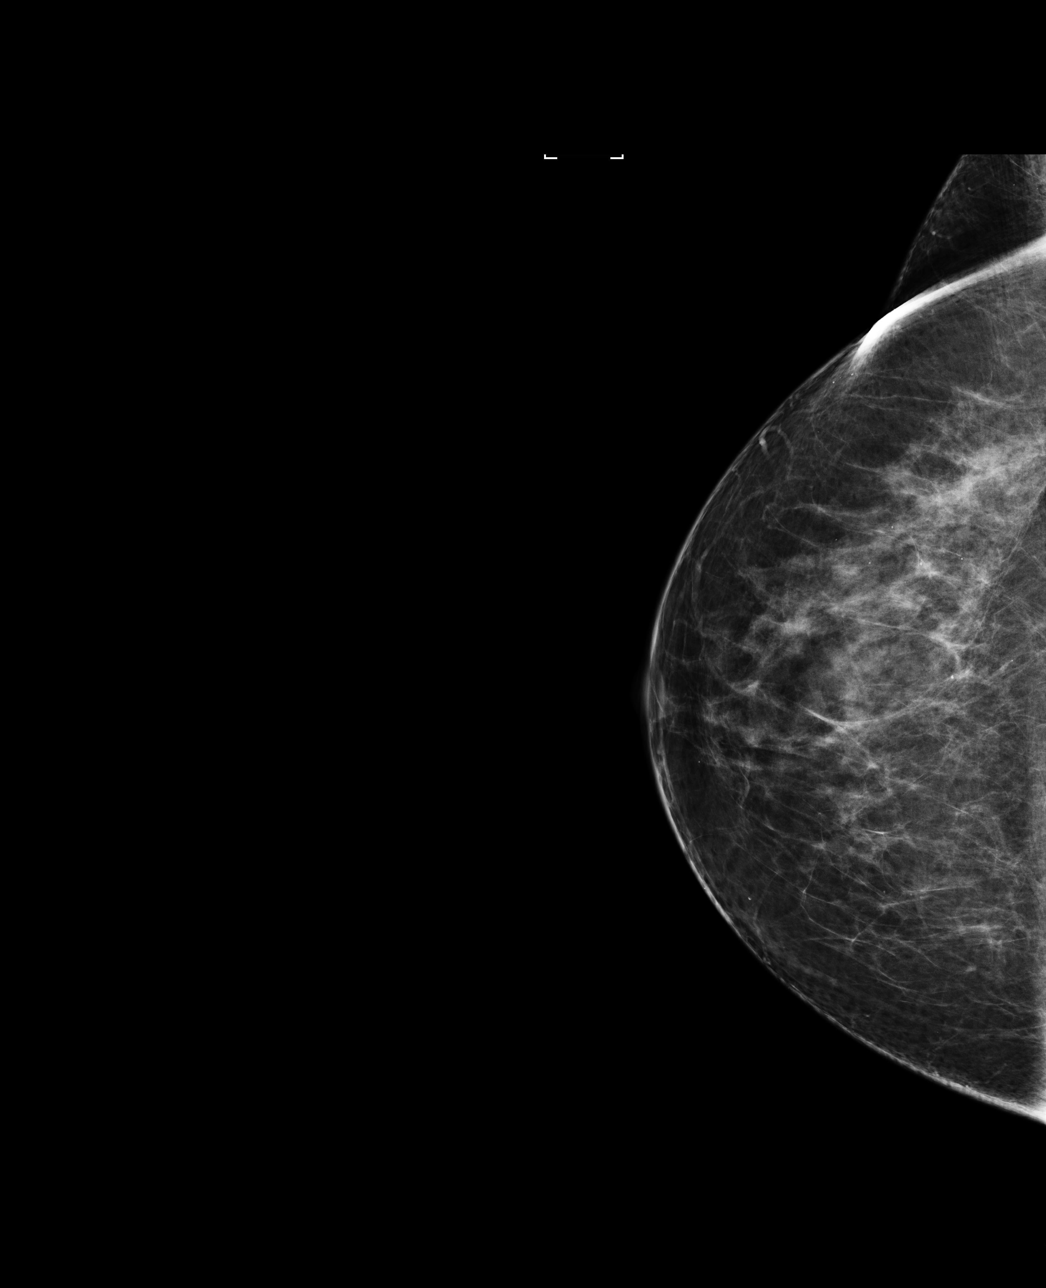

[R MLO]
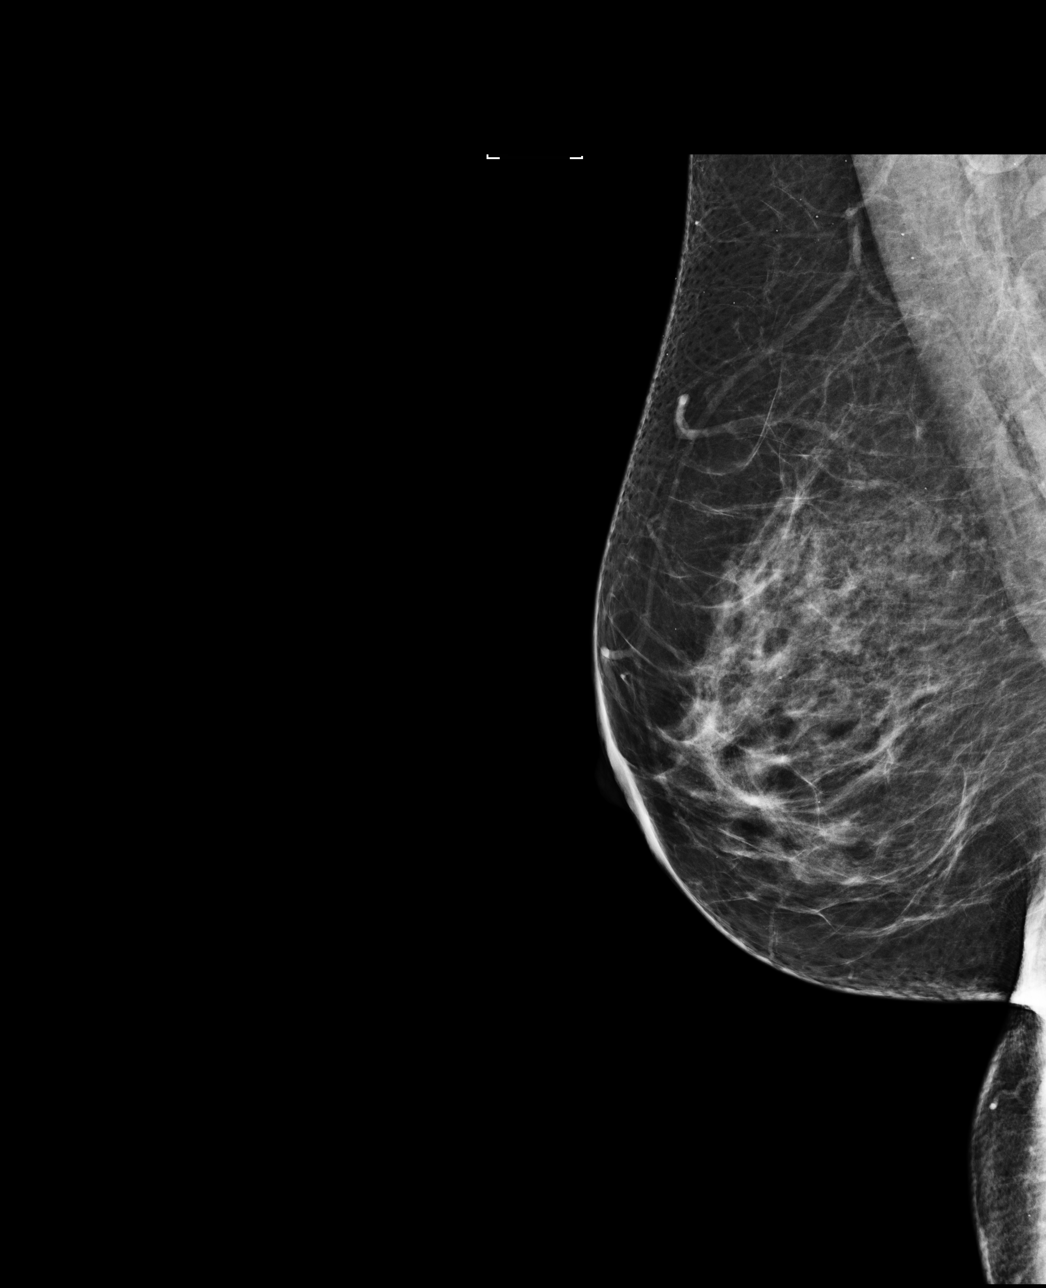

[L CC]
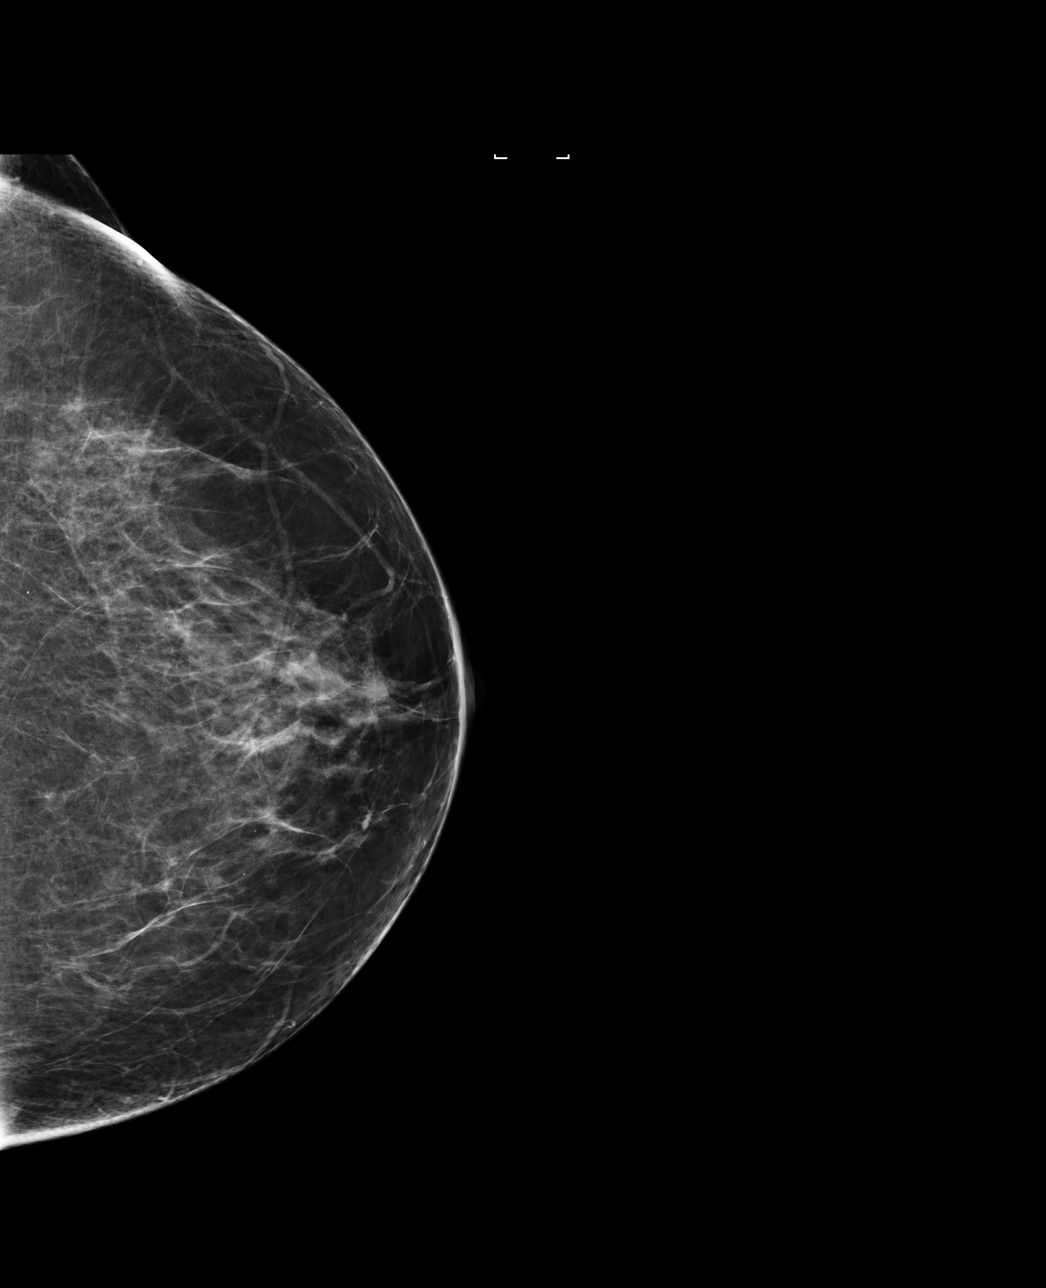

[L MLO]
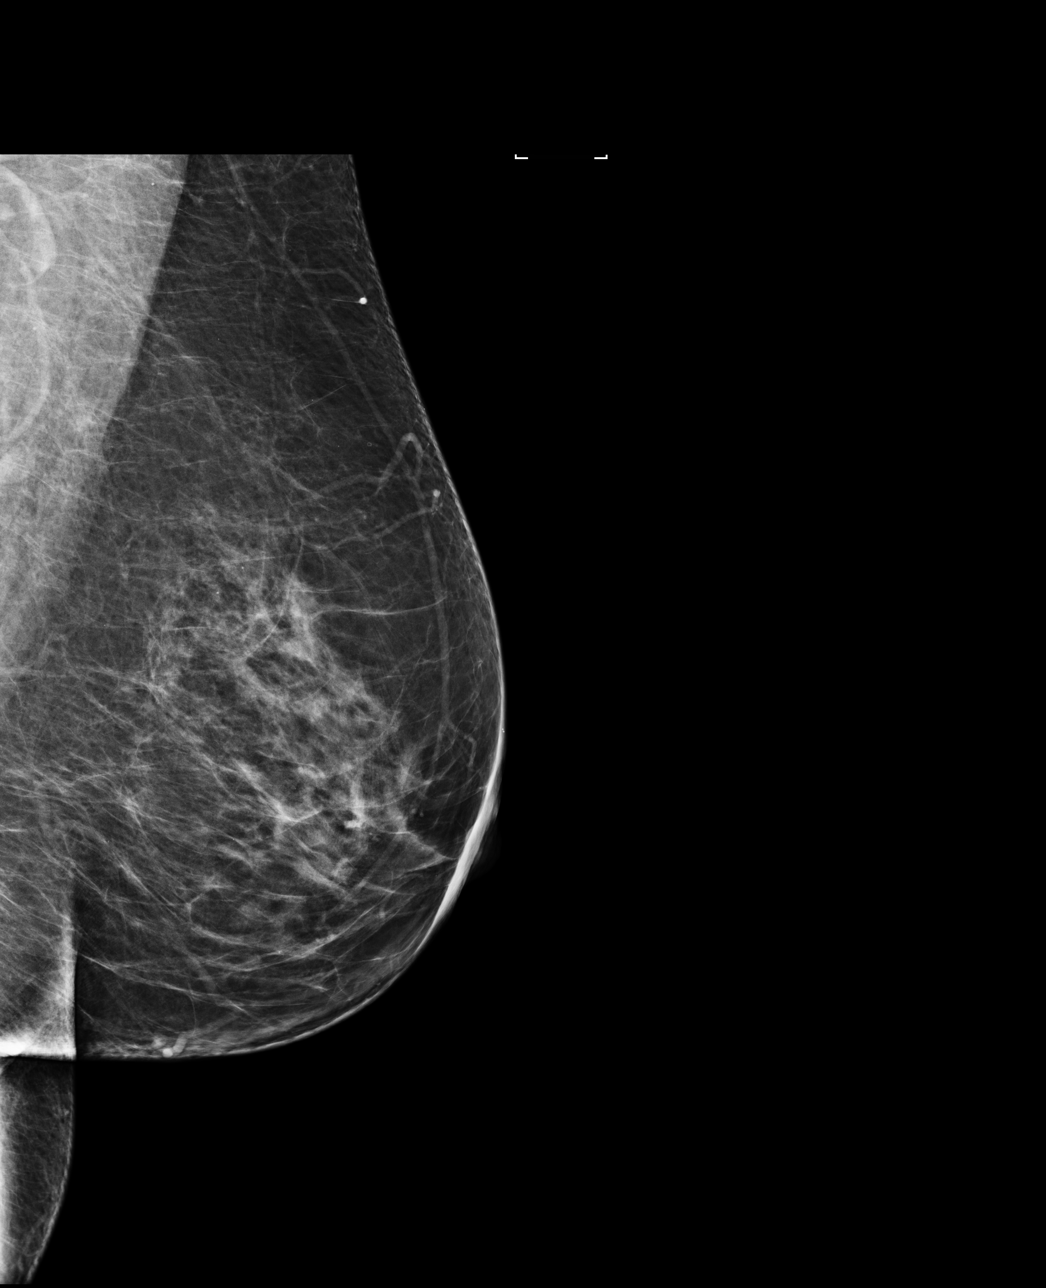

[4 of 4 positions shown; findings below may reference images not displayed]

ACR Breast Density Category b: There are scattered areas of
fibroglandular density.
FINDINGS: There are no findings suspicious for malignancy. Images were
processed with CAD.
IMPRESSION: No mammographic evidence of malignancy. A result letter of this
screening mammogram will be mailed directly to the patient.

RECOMMENDATION:
Screening mammogram in one year. (Code:AM-X-7V9)

BI-RADS CATEGORY  1: Negative.

ADDENDUM:
Patient's prior exam from 03/06/2004 has become available for direct
correlation. No significant changes from the prior exam.

*** End of Addendum ***
ACR Breast Density Category b: There are scattered areas of
fibroglandular density.
FINDINGS: There are no findings suspicious for malignancy. Images were
processed with CAD.
IMPRESSION: No mammographic evidence of malignancy. A result letter of this
screening mammogram will be mailed directly to the patient.

RECOMMENDATION:
Screening mammogram in one year. (Code:AM-X-7V9)

BI-RADS CATEGORY  1: Negative.

## 2022-07-29 ENCOUNTER — Other Ambulatory Visit: Payer: Self-pay | Admitting: Nurse Practitioner

## 2022-07-29 DIAGNOSIS — I10 Essential (primary) hypertension: Secondary | ICD-10-CM

## 2022-10-26 ENCOUNTER — Other Ambulatory Visit: Payer: Self-pay | Admitting: Family Medicine

## 2022-10-26 DIAGNOSIS — I10 Essential (primary) hypertension: Secondary | ICD-10-CM

## 2023-01-28 ENCOUNTER — Other Ambulatory Visit: Payer: Self-pay | Admitting: Family Medicine

## 2023-01-28 DIAGNOSIS — I10 Essential (primary) hypertension: Secondary | ICD-10-CM

## 2023-02-09 ENCOUNTER — Encounter: Payer: Self-pay | Admitting: Family Medicine

## 2023-02-09 ENCOUNTER — Ambulatory Visit (INDEPENDENT_AMBULATORY_CARE_PROVIDER_SITE_OTHER): Payer: No Typology Code available for payment source | Admitting: Family Medicine

## 2023-02-09 VITALS — BP 132/83 | HR 62 | Ht 66.0 in | Wt 187.2 lb

## 2023-02-09 DIAGNOSIS — H8112 Benign paroxysmal vertigo, left ear: Secondary | ICD-10-CM

## 2023-02-09 DIAGNOSIS — F418 Other specified anxiety disorders: Secondary | ICD-10-CM | POA: Diagnosis not present

## 2023-02-09 MED ORDER — ALPRAZOLAM 0.25 MG PO TABS
0.2500 mg | ORAL_TABLET | ORAL | 0 refills | Status: DC | PRN
Start: 2023-02-09 — End: 2023-09-16

## 2023-02-09 NOTE — Progress Notes (Signed)
   Acute Office Visit  Subjective:     Patient ID: Diamond Trujillo, female    DOB: [birth year], 61 y.o.   MRN: [id number]  Chief Complaint  Patient presents with   Dizziness    HPI Patient is in today for dizziness, ear complaint  Patient states that for the last week she has had episodes of vertigo, usually when she lays down or looks up.  These last a few minutes but can last longer.  She also complains of a headache and left ear pain which she describes as "itchy and sharp".  No hearing changes.  No nausea or vomiting.  Patient had a similar episode 8 months ago that resolved spontaneously after 4 days.  Patient denies any focal deficits, unilateral weakness, dysarthria, aphasia.  Patient takes Xanax for situational anxiety, usually with trips or flying.  She last refilled it almost a year ago.  Has a trip upcoming on Friday and would like to know if she could refill.  We discussed that these are typically done through her primary care provider who is listed as Saralyn Pilar.  She has not seen Lequita Halt yet.  Advised her I can refill this dose since it is situational and she does not take it daily but she would need to get future doses from her PCP.  ROS      Objective:    BP 132/83   Pulse 62   Ht 5\' 6"  (1.676 m)   Wt 187 lb 3.2 oz (84.9 kg)   LMP 04/12/2016 (Approximate)   SpO2 100%   BMI 30.21 kg/m    Physical Exam General: Alert, oriented HEENT: PERRLA, EOMI.  Dix-Hallpike maneuver triggers vertigo sensation on the left side.  Epley maneuver was attempted. Neuro: No focal neurodeficits.  No results found for any visits on 02/09/23.      Assessment & Plan:   Benign paroxysmal positional vertigo of left ear Assessment & Plan: Symptoms include dizziness, headache, ear pain on the left side.  Negative for hearing loss.  Exam negative for focal deficits.  Left-sided Dix-Hallpike triggered vertigo symptoms.  Epley maneuver was attempted but uncertain if it was successful.   Patient agreeable to vestibular PT referral.  Orders: -     Ambulatory referral to Physical Therapy  Situational anxiety Assessment & Plan: PMP reviewed.  It is for situational use and she has not refilled since last January.  Can refill it today because patient has a trip upcoming in 2 days.  Advised her to obtain refills from her PCP in the future.  Orders: -     ALPRAZolam; Take 1 tablet (0.25 mg total) by mouth as needed for anxiety. Take 1 tablet prior to flying  Dispense: 30 tablet; Refill: 0     Return if symptoms worsen or fail to improve.  Sandre Kitty, MD

## 2023-02-09 NOTE — Assessment & Plan Note (Signed)
PMP reviewed.  It is for situational use and she has not refilled since last January.  Can refill it today because patient has a trip upcoming in 2 days.  Advised her to obtain refills from her PCP in the future.

## 2023-02-09 NOTE — Patient Instructions (Signed)
It was nice to see you today,  We addressed the following topics today: -It looks like you have BPPV or benign positional vertigo.  I will send in a referral to physical therapy so that they can help you with treating this. - I will refill your Xanax.  In the future if Lequita Halt is still your PCP she will need to refill it.It was nice to see you today,  Have a great day,  Frederic Jericho, MD

## 2023-02-09 NOTE — Assessment & Plan Note (Signed)
Symptoms include dizziness, headache, ear pain on the left side.  Negative for hearing loss.  Exam negative for focal deficits.  Left-sided Dix-Hallpike triggered vertigo symptoms.  Epley maneuver was attempted but uncertain if it was successful.  Patient agreeable to vestibular PT referral.

## 2023-02-18 ENCOUNTER — Ambulatory Visit: Payer: No Typology Code available for payment source | Admitting: Physical Therapy

## 2023-04-14 ENCOUNTER — Other Ambulatory Visit: Payer: Self-pay

## 2023-04-14 DIAGNOSIS — I10 Essential (primary) hypertension: Secondary | ICD-10-CM

## 2023-04-14 DIAGNOSIS — Z0000 Encounter for general adult medical examination without abnormal findings: Secondary | ICD-10-CM

## 2023-04-14 DIAGNOSIS — E7841 Elevated Lipoprotein(a): Secondary | ICD-10-CM

## 2023-04-14 DIAGNOSIS — R7301 Impaired fasting glucose: Secondary | ICD-10-CM

## 2023-04-21 ENCOUNTER — Other Ambulatory Visit: Payer: 59

## 2023-04-25 ENCOUNTER — Other Ambulatory Visit: Payer: Self-pay | Admitting: Family Medicine

## 2023-04-25 DIAGNOSIS — I10 Essential (primary) hypertension: Secondary | ICD-10-CM

## 2023-04-28 ENCOUNTER — Encounter: Payer: 59 | Admitting: Family Medicine

## 2023-05-19 ENCOUNTER — Encounter: Payer: Self-pay | Admitting: Family Medicine

## 2023-07-12 ENCOUNTER — Other Ambulatory Visit: Payer: 59

## 2023-07-20 ENCOUNTER — Encounter: Payer: 59 | Admitting: Family Medicine

## 2023-07-25 ENCOUNTER — Other Ambulatory Visit: Payer: Self-pay | Admitting: Family Medicine

## 2023-07-25 DIAGNOSIS — I10 Essential (primary) hypertension: Secondary | ICD-10-CM

## 2023-09-08 ENCOUNTER — Other Ambulatory Visit: Payer: Self-pay | Admitting: *Deleted

## 2023-09-08 DIAGNOSIS — E7800 Pure hypercholesterolemia, unspecified: Secondary | ICD-10-CM

## 2023-09-08 DIAGNOSIS — I10 Essential (primary) hypertension: Secondary | ICD-10-CM

## 2023-09-08 DIAGNOSIS — R7303 Prediabetes: Secondary | ICD-10-CM

## 2023-09-09 ENCOUNTER — Other Ambulatory Visit

## 2023-09-09 DIAGNOSIS — I10 Essential (primary) hypertension: Secondary | ICD-10-CM

## 2023-09-09 DIAGNOSIS — E7800 Pure hypercholesterolemia, unspecified: Secondary | ICD-10-CM

## 2023-09-09 DIAGNOSIS — R7303 Prediabetes: Secondary | ICD-10-CM

## 2023-09-10 ENCOUNTER — Ambulatory Visit: Payer: Self-pay

## 2023-09-10 LAB — LIPID PANEL
Chol/HDL Ratio: 4.6 ratio — ABNORMAL HIGH (ref 0.0–4.4)
Cholesterol, Total: 237 mg/dL — ABNORMAL HIGH (ref 100–199)
HDL: 52 mg/dL (ref >39)
LDL Chol Calc (NIH): 169 mg/dL — ABNORMAL HIGH (ref 0–99)
Triglycerides: 93 mg/dL (ref 0–149)
VLDL Cholesterol Cal: 16 mg/dL (ref 5–40)

## 2023-09-10 LAB — COMPREHENSIVE METABOLIC PANEL WITH GFR
ALT: 21 IU/L (ref 0–32)
AST: 17 IU/L (ref 0–40)
Albumin: 4.8 g/dL (ref 3.9–4.9)
Alkaline Phosphatase: 71 IU/L (ref 44–121)
BUN/Creatinine Ratio: 34 — ABNORMAL HIGH (ref 12–28)
BUN: 25 mg/dL (ref 8–27)
Bilirubin Total: 0.5 mg/dL (ref 0.0–1.2)
CO2: 17 mmol/L — ABNORMAL LOW (ref 20–29)
Calcium: 9.4 mg/dL (ref 8.7–10.3)
Chloride: 104 mmol/L (ref 96–106)
Creatinine, Ser: 0.73 mg/dL (ref 0.57–1.00)
Globulin, Total: 2 g/dL (ref 1.5–4.5)
Glucose: 90 mg/dL (ref 70–99)
Potassium: 4.5 mmol/L (ref 3.5–5.2)
Sodium: 143 mmol/L (ref 134–144)
Total Protein: 6.8 g/dL (ref 6.0–8.5)
eGFR: 93 mL/min/{1.73_m2} (ref >59)

## 2023-09-10 LAB — CBC WITH DIFFERENTIAL/PLATELET
Basophils Absolute: 0.1 10*3/uL (ref 0.0–0.2)
Basos: 1 %
EOS (ABSOLUTE): 0.1 10*3/uL (ref 0.0–0.4)
Eos: 2 %
Hematocrit: 42.1 % (ref 34.0–46.6)
Hemoglobin: 14 g/dL (ref 11.1–15.9)
Immature Grans (Abs): 0 10*3/uL (ref 0.0–0.1)
Immature Granulocytes: 0 %
Lymphocytes Absolute: 1.7 10*3/uL (ref 0.7–3.1)
Lymphs: 31 %
MCH: 31.2 pg (ref 26.6–33.0)
MCHC: 33.3 g/dL (ref 31.5–35.7)
MCV: 94 fL (ref 79–97)
Monocytes Absolute: 0.4 10*3/uL (ref 0.1–0.9)
Monocytes: 8 %
Neutrophils Absolute: 3.3 10*3/uL (ref 1.4–7.0)
Neutrophils: 58 %
Platelets: 158 10*3/uL (ref 150–450)
RBC: 4.49 x10E6/uL (ref 3.77–5.28)
RDW: 12.6 % (ref 11.7–15.4)
WBC: 5.7 10*3/uL (ref 3.4–10.8)

## 2023-09-10 LAB — HEMOGLOBIN A1C
Est. average glucose Bld gHb Est-mCnc: 108 mg/dL
Hgb A1c MFr Bld: 5.4 % (ref 4.8–5.6)

## 2023-09-10 LAB — TSH: TSH: 1.31 u[IU]/mL (ref 0.450–4.500)

## 2023-09-16 ENCOUNTER — Ambulatory Visit (INDEPENDENT_AMBULATORY_CARE_PROVIDER_SITE_OTHER)

## 2023-09-16 VITALS — BP 141/82 | HR 62 | Ht 66.0 in | Wt 182.1 lb

## 2023-09-16 DIAGNOSIS — J4520 Mild intermittent asthma, uncomplicated: Secondary | ICD-10-CM | POA: Insufficient documentation

## 2023-09-16 DIAGNOSIS — F418 Other specified anxiety disorders: Secondary | ICD-10-CM

## 2023-09-16 DIAGNOSIS — Z1231 Encounter for screening mammogram for malignant neoplasm of breast: Secondary | ICD-10-CM

## 2023-09-16 DIAGNOSIS — I10 Essential (primary) hypertension: Secondary | ICD-10-CM

## 2023-09-16 DIAGNOSIS — G8929 Other chronic pain: Secondary | ICD-10-CM | POA: Insufficient documentation

## 2023-09-16 DIAGNOSIS — M545 Other chronic pain: Secondary | ICD-10-CM

## 2023-09-16 DIAGNOSIS — E785 Hyperlipidemia, unspecified: Secondary | ICD-10-CM | POA: Insufficient documentation

## 2023-09-16 MED ORDER — MELOXICAM 15 MG PO TABS: 15.0000 mg | ORAL_TABLET | Freq: Every day | ORAL | 0 refills | Status: AC | PRN

## 2023-09-16 MED ORDER — MELOXICAM 15 MG PO TABS: 15.0000 mg | ORAL_TABLET | Freq: Every day | ORAL | 0 refills | Status: DC | PRN

## 2023-09-16 MED ORDER — FLUTICASONE-SALMETEROL 100-50 MCG/ACT IN AEPB: 1.0000 | INHALATION_SPRAY | Freq: Two times a day (BID) | RESPIRATORY_TRACT | 3 refills | Status: AC

## 2023-09-16 MED ORDER — ALPRAZOLAM 0.25 MG PO TABS: 0.2500 mg | ORAL_TABLET | ORAL | 0 refills | Status: AC | PRN

## 2023-09-16 NOTE — Patient Instructions (Addendum)
 It was nice to see you today!  As we discussed in clinic:   -Continue taking your losartan  as prescribed for your blood pressure. -I have sent refills of your inhaler, alprazolam , and meloxicam  to your pharmacy -I have also placed the referral for your mammogram. They will call you to schedule this.  -I encourage you to be thinking about where you would like to have your pap smear performed!  -Your labs looked great and I encourage you to continue with your diet changes and exercise! You are doing awesome!   -I will plan to see you back in 1 year for your next physical   If you have any problems before your next visit feel free to message me via MyChart (minor issues or questions) or call the office, otherwise you may reach out to schedule an office visit.  Thank you! Meryl Acosta, PA-C

## 2023-09-16 NOTE — Assessment & Plan Note (Signed)
 PDMP reviewed.  No aberrancies.  Overdose risk score below average.  Has not been refilled in several months.  Continue alprazolam  0.25 mg as needed for situational anxiety including claustrophobia, backseat car rides, flights.  Will continue to monitor.

## 2023-09-16 NOTE — Assessment & Plan Note (Signed)
 Stable and well-controlled on meloxicam  15 mg daily as needed.

## 2023-09-16 NOTE — Assessment & Plan Note (Addendum)
 Last lipid panel: LDL 169, HDL 52, triglycerides 93. The 10-year ASCVD risk score (Arnett DK, et al., 2019) is: 7.6% Given the patient's age, low ASCVD risk score, no presence of diabetes, LDL range within 70-189, still considered acceptable to monitor lipids periodically and controlled with diet and lifestyle.  Patient also not interested in initiating statin therapy at this time.  Encourage patient to continue with her diet changes and exercise as tolerated.  Will continue to monitor lipids periodically and discuss initiating medication therapy as indicated.

## 2023-09-16 NOTE — Assessment & Plan Note (Signed)
 Stable and well-controlled.  Has been having these intermittent episodes since 2022 when she gets sick, usually following a bout with seasonal allergies.  Refilled her Advair to have on hand for acute episodes.  Continue albuterol  as needed for wheezing.  Will continue to monitor.

## 2023-09-16 NOTE — Progress Notes (Signed)
 Established Patient Office Visit  Subjective   Patient ID: Diamond Trujillo, female    DOB: [birth year]  Age: 63 y.o. MRN: [id number]  Chief Complaint  Patient presents with   Annual Exam    HPI  Diamond Trujillo is a 63 y.o. female who presents to the clinic today for routine follow up.   HTN: Pt currently taking 100 mg of losartan  daily for her blood pressure.  She reports excellent compliance with this medication.  She does check her blood pressure at home and reports that it runs on average between 110-120 over 70s..  She currently denies chest pain, shortness of breath, palpitations, vision changes, edema.  Denies dizziness/faintness upon standing.  Denies episodes of hypotension.  Situational anxiety: Patient has a history of situational anxiety which she reports is mainly related to claustrophobia, riding in the backseat of a car or airplane.  She takes alprazolam  0.25 mg as needed for this, which she is able to stretch out 1 prescription over the course of 6 months.  She reports that her symptoms are well-controlled when she takes this medication.Aaron Aas  HLD: Patient's lipids have been elevated for about 2 years, but however each year they continue to increase more.  He is not currently on any cholesterol-lowering medications. The 10-year ASCVD risk score (Arnett DK, et al., 2019) is: 7.6%   L4-L5 Radiculopathy: Controlled with Meloxicam . Reports that the pain only bothers her intermittently and is well-controlled when she takes the meloxicam  as needed.  Intermittent wheezing: Patient is bothered by intermittent esophageal irritation and associated wheezing, especially during allergy season.  It is documented that for when she has these episodes she has been prescribed Advair to use as needed for flares and albuterol  as needed in combination.  She reports that she has not had an episode in a while, however is requesting a refill on her Advair to have on hand in the event that an episode  occurs.  Of note patient reports that she has cut out sugar from her diet completely.  She reports that she has not had sugar in the 126 days.  She is continuing to improve her diet and incorporate exercise as tolerated.    ROS Per HPI.    Objective:     BP (!) 141/82   Pulse 62   Ht 5\' 6"  (1.676 m)   Wt 182 lb 1.3 oz (82.6 kg)   LMP 04/12/2016 (Approximate)   SpO2 100%   BMI 29.39 kg/m    Physical Exam Constitutional:      General: She is not in acute distress.    Appearance: Normal appearance.  Cardiovascular:     Rate and Rhythm: Normal rate and regular rhythm.     Heart sounds: Normal heart sounds. No murmur heard.    No friction rub. No gallop.  Pulmonary:     Effort: Pulmonary effort is normal. No respiratory distress.     Breath sounds: Normal breath sounds.  Musculoskeletal:        General: No swelling.  Skin:    General: Skin is warm and dry.  Neurological:     General: No focal deficit present.     Mental Status: She is alert.  Psychiatric:        Mood and Affect: Mood normal.        Behavior: Behavior normal.        Thought Content: Thought content normal.      No results found for any visits on 09/16/23.  Last CBC Lab Results  Component Value Date   WBC 5.7 09/09/2023   HGB 14.0 09/09/2023   HCT 42.1 09/09/2023   MCV 94 09/09/2023   MCH 31.2 09/09/2023   RDW 12.6 09/09/2023   PLT 158 09/09/2023   Last metabolic panel Lab Results  Component Value Date   GLUCOSE 90 09/09/2023   NA 143 09/09/2023   K 4.5 09/09/2023   CL 104 09/09/2023   CO2 17 (L) 09/09/2023   BUN 25 09/09/2023   CREATININE 0.73 09/09/2023   EGFR 93 09/09/2023   CALCIUM 9.4 09/09/2023   PROT 6.8 09/09/2023   ALBUMIN 4.8 09/09/2023   LABGLOB 2.0 09/09/2023   AGRATIO 1.9 04/20/2022   BILITOT 0.5 09/09/2023   ALKPHOS 71 09/09/2023   AST 17 09/09/2023   ALT 21 09/09/2023   Last lipids Lab Results  Component Value Date   CHOL 237 (H) 09/09/2023   HDL 52  09/09/2023   LDLCALC 169 (H) 09/09/2023   TRIG 93 09/09/2023   CHOLHDL 4.6 (H) 09/09/2023   Last hemoglobin A1c Lab Results  Component Value Date   HGBA1C 5.4 09/09/2023   Last thyroid functions Lab Results  Component Value Date   TSH 1.310 09/09/2023     The 10-year ASCVD risk score (Arnett DK, et al., 2019) is: 7.6%    Assessment & Plan:   Screening mammogram for breast cancer -     3D Screening Mammogram, Left and Right; Future  Situational anxiety Assessment & Plan: PDMP reviewed.  No aberrancies.  Overdose risk score below average.  Has not been refilled in several months.  Continue alprazolam  0.25 mg as needed for situational anxiety including claustrophobia, backseat car rides, flights.  Will continue to monitor.  Orders: -     ALPRAZolam ; Take 1 tablet (0.25 mg total) by mouth as needed for anxiety. Take 1 tablet prior to flying  Dispense: 30 tablet; Refill: 0  Hyperlipidemia, unspecified hyperlipidemia type Assessment & Plan: Last lipid panel: LDL 169, HDL 52, triglycerides 93. The 10-year ASCVD risk score (Arnett DK, et al., 2019) is: 7.6% Given the patient's age, low ASCVD risk score, no presence of diabetes, LDL range within 70-189, still considered acceptable to monitor lipids periodically and controlled with diet and lifestyle.  Patient also not interested in initiating statin therapy at this time.  Encourage patient to continue with her diet changes and exercise as tolerated.  Will continue to monitor lipids periodically and discuss initiating medication therapy as indicated.     RAD (reactive airway disease) with wheezing, mild intermittent, uncomplicated Assessment & Plan: Stable and well-controlled.  Has been having these intermittent episodes since 2022 when she gets sick, usually following a bout with seasonal allergies.  Refilled her Advair to have on hand for acute episodes.  Continue albuterol  as needed for wheezing.  Will continue to  monitor.   Chronic bilateral low back pain without sciatica Assessment & Plan: Stable and well-controlled on meloxicam  15 mg daily as needed.  Orders: -     Meloxicam ; Take 1 tablet (15 mg total) by mouth daily as needed for pain.  Dispense: 30 tablet; Refill: 0  Essential hypertension Assessment & Plan: BP goal <130/80.  BP above goal in office today, however patient reports consistent ambulatory blood pressure monitoring with readings averaging between 110s to 120s over 70s.  Continue losartan  100 mg daily for blood pressure.  Encouraged her to continue ambulatory blood pressure monitoring and notify me of any extreme high or low readings.  Last CMP normal.  Will continue to monitor.   Other orders -     Fluticasone -Salmeterol; Inhale 1 puff into the lungs 2 (two) times daily.  Dispense: 1 each; Refill: 3   Return in about 1 year (around 09/15/2024) for Physical, HTN, HLD.    Odilia Bennett, PA-C

## 2023-09-16 NOTE — Assessment & Plan Note (Signed)
 BP goal <130/80.  BP above goal in office today, however patient reports consistent ambulatory blood pressure monitoring with readings averaging between 110s to 120s over 70s.  Continue losartan  100 mg daily for blood pressure.  Encouraged her to continue ambulatory blood pressure monitoring and notify me of any extreme high or low readings.  Last CMP normal.  Will continue to monitor.

## 2023-10-21 ENCOUNTER — Ambulatory Visit
Admission: RE | Admit: 2023-10-21 | Discharge: 2023-10-21 | Disposition: A | Discharge status: Home / Self Care | Source: Ambulatory Visit

## 2023-10-21 DIAGNOSIS — Z1231 Encounter for screening mammogram for malignant neoplasm of breast: Secondary | ICD-10-CM

## 2023-10-26 ENCOUNTER — Ambulatory Visit: Payer: Self-pay

## 2023-10-31 ENCOUNTER — Encounter: Payer: Self-pay | Admitting: Physician Assistant

## 2024-01-20 ENCOUNTER — Other Ambulatory Visit: Payer: Self-pay | Admitting: Family Medicine

## 2024-01-20 DIAGNOSIS — I10 Essential (primary) hypertension: Secondary | ICD-10-CM

## 2024-09-14 ENCOUNTER — Other Ambulatory Visit

## 2024-09-21 ENCOUNTER — Encounter
